# Patient Record
Sex: Male | Born: 1940 | Race: White | Hispanic: No | Marital: Married | Smoking: Never smoker
Health system: Southern US, Community
[De-identification: ages and names within clinical notes are randomized; demographics above are authoritative.]

## PROBLEM LIST (undated history)

## (undated) DIAGNOSIS — K7460 Unspecified cirrhosis of liver: Secondary | ICD-10-CM

## (undated) DIAGNOSIS — E785 Hyperlipidemia, unspecified: Secondary | ICD-10-CM

## (undated) DIAGNOSIS — Z8619 Personal history of other infectious and parasitic diseases: Secondary | ICD-10-CM

## (undated) HISTORY — DX: Hyperlipidemia, unspecified: E78.5

## (undated) HISTORY — DX: Unspecified cirrhosis of liver: K74.60

## (undated) HISTORY — DX: Personal history of other infectious and parasitic diseases: Z86.19

## (undated) HISTORY — PX: APPENDECTOMY: SHX54

---

## 2008-03-16 ENCOUNTER — Ambulatory Visit: Payer: Self-pay | Admitting: Family Medicine

## 2008-03-16 DIAGNOSIS — IMO0002 Reserved for concepts with insufficient information to code with codable children: Secondary | ICD-10-CM | POA: Insufficient documentation

## 2008-03-16 DIAGNOSIS — R030 Elevated blood-pressure reading, without diagnosis of hypertension: Secondary | ICD-10-CM | POA: Insufficient documentation

## 2008-03-16 DIAGNOSIS — E785 Hyperlipidemia, unspecified: Secondary | ICD-10-CM

## 2008-03-16 DIAGNOSIS — S93409A Sprain of unspecified ligament of unspecified ankle, initial encounter: Secondary | ICD-10-CM | POA: Insufficient documentation

## 2008-03-29 ENCOUNTER — Ambulatory Visit: Payer: Self-pay | Admitting: Family Medicine

## 2008-03-29 LAB — CONVERTED CEMR LAB
Nitrite: NEGATIVE
Specific Gravity, Urine: 1.015
WBC Urine, dipstick: NEGATIVE

## 2008-04-05 ENCOUNTER — Ambulatory Visit: Payer: Self-pay | Admitting: Family Medicine

## 2008-04-05 DIAGNOSIS — H9190 Unspecified hearing loss, unspecified ear: Secondary | ICD-10-CM | POA: Insufficient documentation

## 2008-04-05 DIAGNOSIS — R7309 Other abnormal glucose: Secondary | ICD-10-CM | POA: Insufficient documentation

## 2008-04-05 DIAGNOSIS — R0602 Shortness of breath: Secondary | ICD-10-CM | POA: Insufficient documentation

## 2008-04-05 LAB — CONVERTED CEMR LAB
ALT: 30 units/L (ref 0–53)
Albumin: 3.7 g/dL (ref 3.5–5.2)
Alkaline Phosphatase: 86 units/L (ref 39–117)
BUN: 13 mg/dL (ref 6–23)
CO2: 29 meq/L (ref 19–32)
Eosinophils Relative: 3.4 % (ref 0.0–5.0)
GFR calc Af Amer: 66 mL/min
Glucose, Bld: 144 mg/dL — ABNORMAL HIGH (ref 70–99)
HCT: 39.2 % (ref 39.0–52.0)
Hemoglobin: 13.9 g/dL (ref 13.0–17.0)
Lymphocytes Relative: 40.4 % (ref 12.0–46.0)
Monocytes Absolute: 0.4 10*3/uL (ref 0.1–1.0)
Monocytes Relative: 9.1 % (ref 3.0–12.0)
Neutro Abs: 2.2 10*3/uL (ref 1.4–7.7)
Platelets: 174 10*3/uL (ref 150–400)
Potassium: 3.4 meq/L — ABNORMAL LOW (ref 3.5–5.1)
Total CHOL/HDL Ratio: 5.3
Total Protein: 6.9 g/dL (ref 6.0–8.3)
WBC: 4.7 10*3/uL (ref 4.5–10.5)

## 2008-04-08 ENCOUNTER — Encounter: Payer: Self-pay | Admitting: Family Medicine

## 2008-04-08 LAB — CONVERTED CEMR LAB: Hgb A1c MFr Bld: 6.7 % — ABNORMAL HIGH (ref 4.6–6.0)

## 2008-04-26 ENCOUNTER — Ambulatory Visit: Payer: Self-pay | Admitting: Family Medicine

## 2008-04-26 LAB — CONVERTED CEMR LAB: OCCULT 3: NEGATIVE

## 2008-04-28 ENCOUNTER — Encounter: Payer: Self-pay | Admitting: Family Medicine

## 2009-03-13 ENCOUNTER — Telehealth: Payer: Self-pay | Admitting: Family Medicine

## 2009-04-03 ENCOUNTER — Ambulatory Visit: Payer: Self-pay | Admitting: Family Medicine

## 2009-04-03 DIAGNOSIS — M129 Arthropathy, unspecified: Secondary | ICD-10-CM

## 2009-04-03 DIAGNOSIS — K589 Irritable bowel syndrome without diarrhea: Secondary | ICD-10-CM

## 2009-04-18 ENCOUNTER — Telehealth: Payer: Self-pay | Admitting: Family Medicine

## 2009-09-25 ENCOUNTER — Telehealth: Payer: Self-pay | Admitting: Family Medicine

## 2009-09-27 ENCOUNTER — Encounter: Payer: Self-pay | Admitting: Family Medicine

## 2010-09-03 ENCOUNTER — Encounter: Payer: Self-pay | Admitting: Orthopedic Surgery

## 2010-09-11 NOTE — Letter (Signed)
Summary: Generic Letter  [place] at [facility]  [address] [place], [place] [address]   Phone: [phone]  Fax: [phone]    09/27/2009  Trial Court Administrator Attention: Lonn Georgia Request [address] [address]   Re:  Bennie Hind   [address]   [place]  [address]   Juror # [phone]   panel # [phone]  To Whom It May Concern:  Mr. Hardenbrook is a patient under my care and has a physical disability (deafness) in which would prevent him from serving.  Due to this permanent disability, I would like to request that you excuse him permantely.   Thank you for your consideration in this matter.        Sincerely,      Verdie Drown, M.D., FAAFP                                          Sincerely,   Madison Hickman, RN

## 2010-09-11 NOTE — Progress Notes (Signed)
Summary: Pt needing a letter for [employer] Note Call from Patient Call back at [facility] Phone [phone]   Caller: Patient Summary of Call: Pt called using [employer]. Pt is needing a note from Dr. Scotty Court to be excused from [place] duty because pt is deaf. Please let pt know when letter is ready for pick up or you can mail it to patient.  Initial call taken by: Lucy Antigua,  September 25, 2009 10:10 AM  Follow-up for Phone Call         i need the letter from the court adminstrator  Follow-up by: Pura Spice, RN,  September 26, 2009 8:35 AM  Additional Follow-up for Phone Call Additional follow up Details #1::        What letter?  We usually just sent a note stating pt is not able to attend jury duty with her medical problems???  Not sure what you need, or where to get it? Additional Follow-up by: Lynann Beaver CMA,  September 26, 2009 8:48 AM    Additional Follow-up for Phone Call Additional follow up Details #2::    they send pt letter stating court number  along wth juror number and panel number  Follow-up by: Pura Spice, RN,  September 26, 2009 9:28 AM  Additional Follow-up for Phone Call Additional follow up Details #3:: Details for Additional Follow-up Action Taken: Left message for pt to bring court papers Additional Follow-up by: Lynann Beaver CMA,  September 26, 2009 9:40 AM

## 2011-07-22 ENCOUNTER — Telehealth: Payer: Self-pay | Admitting: Family Medicine

## 2011-07-22 NOTE — Telephone Encounter (Signed)
Ok but myschedule is backed up and not until  After the new year.

## 2011-07-22 NOTE — Telephone Encounter (Signed)
Is a [facility] pt and is requesting to be switched to you his daughter Ronald Jensen was also a stafford pt and has converted over to you. Pt would love to be seen by you because [name] is handicapped and having the same pcp would make things easier on family. Pt is aware that we are no longer excepting any more stafford pts that we are at the max but requested to ask you. Please advise

## 2011-08-27 ENCOUNTER — Encounter: Payer: Self-pay | Admitting: Internal Medicine

## 2011-09-06 ENCOUNTER — Ambulatory Visit: Payer: Self-pay | Admitting: Internal Medicine

## 2011-10-08 ENCOUNTER — Ambulatory Visit: Payer: Self-pay | Admitting: Internal Medicine

## 2011-10-08 DIAGNOSIS — Z0289 Encounter for other administrative examinations: Secondary | ICD-10-CM

## 2011-11-01 ENCOUNTER — Ambulatory Visit (INDEPENDENT_AMBULATORY_CARE_PROVIDER_SITE_OTHER): Payer: Medicare Other | Admitting: Internal Medicine

## 2011-11-01 ENCOUNTER — Encounter: Payer: Self-pay | Admitting: Internal Medicine

## 2011-11-01 VITALS — BP 140/84 | HR 74 | Temp 98.3°F | Wt 229.0 lb

## 2011-11-01 DIAGNOSIS — K8020 Calculus of gallbladder without cholecystitis without obstruction: Secondary | ICD-10-CM | POA: Insufficient documentation

## 2011-11-01 DIAGNOSIS — K766 Portal hypertension: Secondary | ICD-10-CM

## 2011-11-01 DIAGNOSIS — H9190 Unspecified hearing loss, unspecified ear: Secondary | ICD-10-CM

## 2011-11-01 DIAGNOSIS — Z972 Presence of dental prosthetic device (complete) (partial): Secondary | ICD-10-CM | POA: Insufficient documentation

## 2011-11-01 DIAGNOSIS — Z98811 Dental restoration status: Secondary | ICD-10-CM

## 2011-11-01 DIAGNOSIS — I708 Atherosclerosis of other arteries: Secondary | ICD-10-CM

## 2011-11-01 DIAGNOSIS — R188 Other ascites: Secondary | ICD-10-CM | POA: Insufficient documentation

## 2011-11-01 DIAGNOSIS — E8809 Other disorders of plasma-protein metabolism, not elsewhere classified: Secondary | ICD-10-CM

## 2011-11-01 DIAGNOSIS — K7460 Unspecified cirrhosis of liver: Secondary | ICD-10-CM | POA: Insufficient documentation

## 2011-11-01 LAB — BASIC METABOLIC PANEL
BUN: 10 mg/dL (ref 6–23)
CO2: 29 mEq/L (ref 19–32)
Chloride: 103 mEq/L (ref 96–112)
Glucose, Bld: 134 mg/dL — ABNORMAL HIGH (ref 70–99)
Potassium: 4.4 mEq/L (ref 3.5–5.3)

## 2011-11-01 LAB — AMMONIA: Ammonia: 67 umol/L — ABNORMAL HIGH (ref 16–53)

## 2011-11-01 NOTE — Progress Notes (Signed)
Subjective:    Patient ID: Ronald Jensen, male    DOB: Aug 04, 1941, 71 y.o.   MRN: [id number]  HPI Patient comes in as new patient visit to this provider  . Previous care was Dr Scotty Court . He has deafness and has daughter here for signing. Patient seen last time by Dr. Scotty Court about 3 years ago.  His last checkup was 4 years ago.  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     Except for some mild arthritis he really has not had any chronic disease until he presented March 12 to 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  At that time he had evaluation which showed cirrhosis felt most likely to [facility] with an autoimmune workup pending. His viral serologies were negative. He was also shown to have a pancreatic irregularity on CT and an MRI was done which showed pancreatic atrophy of the tail findings could be consistent with focal chronic pancreatitis or stricture but in a cold pancreatic ductal adenocarcinoma could not be completely excluded and GI consultation was recommended for a possible EUS versus short-term and routine in 3 months that showed no significant abnormality. He was also noted to have cholelithiasis in his gallbladder without wall thickening. Scans also showed atherosclerosis in his aorta without aneurysm and some coronary artery calcifications.  Echo cardiogram  Neg. Nl lv function  Valves. A small 5mm nodule left breast . He was begun on 2 diuretics ;spironolactone and Lasix and since that time he has lost at least 11 pounds. He denies any shortness of breath; his swelling in his abdomen is down but still significant as well as his feet. He denies any fever cough bleeding.  No falling. No change in his vision.  He has had some faint jaundice.  In hospital he had a mildly elevated ammonia at 54 and  was started on lactulose. He denies any shaking and no asterisks  Or sedation.    Patient and daughter are here to go over the diagnosis and explained some of the study results. He has an appointment on April 10 with the GI specialist Dr. Chestine Spore in [place] [facility] appetite is okay no fever bleeding coughing syncope  Rest as per hpi  Past history family history social history directly entered in the electronic medical record. By provider      Objective:   Physical Exam WDWN in nad  Deaf and daughter   Signing HEENT [place] ear nl perrla eoms nl OP clear midline tongue .Nares patent. Neck: Supple without adenopathy or masses or bruits Chest:  Clear to A&P without wheezes rales or rhonchi CV:  S1-S2 no gallops or murmurs peripheral perfusion is normal Breast: normal by inspection . No dimpling, discharge, masses, tenderness or discharge .left is lumpy but no discrete mass Abd: Large but not tense there is ascites nontender there is some. Umbilical induration? possible varices.  No masses are felt otherwise. Non tender Extremities shows 2-3+ edema no lesions noted NEURO: alert responsive bu sign   eoms nl  No focal muscle weakness or atrophy. Gait WNL.  Grossly non focal. No tremor or abnormal movement. No asterixis  Skin: normal capillary refill ,turgor ,  No acute rashes ,petechiae or bruising ? No jaundice.  Laboratory studies from a week or so ago  showed a sodium of 134 creatinine of 0.86 albumin 2.4 LFTs normal otherwise   H&H 11.4 over 33.9 hepatitis screen negative  plts 83       Assessment & Plan   Newly diagnosed hepatic cirrhosis with portal hypertension. Undetermined etiology suspected Elita Boone. Autoimmune evaluation pending. Ascites and anasarca related improved on diuretic.  Asymptomatic vascular disease atherosclerosis in imaging studies including coronary arteries left main and LAD  Abnormality on pancreatic MRI mild ductal dilatation needs  followup  Gallstones Breast area prob insignificant .   Pneumovax given today .  Discussion with patient and daughter today about the current diagnosis and condition as best possible from the records available today. This is the first time I have met this patient and her primary care office.  We'll plan laboratory studies today to include BMP ammonia and a pro time will send these results to a specialist ; then plan followup.  Total visit > 50% spent counseling and coordinating care  Reviewed data

## 2011-11-01 NOTE — Patient Instructions (Addendum)
Will notify you  of labs when available. And  Send to your specialist .  Make sure  Liver specialist  Gets [place] a copy of their evaluation.  Advise pneumovax   Ask your specialist about this.   Cirrhosis Cirrhosis is a condition of scarring of the liver which is caused when the liver has tried repairing itself following damage. This damage may come from a previous infection such as one of the forms of hepatitis (usually hepatitis C), or the damage may come from being injured by toxins. The main toxin that causes this damage is alcohol. The scarring of the liver from use of alcohol is irreversible. That means the liver cannot return to normal even though alcohol is not used any more. The main danger of hepatitis C infection is that it may cause long-lasting (chronic) liver disease, and this also may lead to cirrhosis. This complication is progressive and irreversible. CAUSES  Prior to available blood tests, hepatitis C could be contracted by blood transfusions. Since testing of blood has improved, this is now unlikely. This infection can also be contracted through intravenous drug use and the sharing of needles. It can also be contracted through sexual relationships. The injury caused by alcohol comes from too much use. It is not a few drinks that poison the liver, but years of misuse. Usually there will be some signs and symptoms early with scarring of the liver that suggest the development of better habits. Alcohol should never be used while using acetaminophen. A small dose of both taken together may cause irreversible damage to the liver. HOME CARE INSTRUCTIONS  There is no specific treatment for cirrhosis. However, there are things you can do to avoid making the condition worse.  Rest as needed.   Eat a well-balanced diet. Your caregiver can help you with suggestions.   Vitamin supplements including vitamins A, K, D, and thiamine can help.   A low-salt diet, water restriction, or diuretic  medicine may be needed to reduce fluid retention.   Avoid alcohol. This can be extremely toxic if combined with acetaminophen.   Avoid drugs which are toxic to the liver. Some of these include isoniazid, methyldopa, acetaminophen, anabolic steroids (muscle-building drugs), erythromycin, and oral contraceptives (birth control pills). Check with your caregiver to make sure medicines you are presently taking will not be harmful.   Periodic blood tests may be required. Follow your caregiver's advice regarding the timing of these.   Milk thistle is an herbal remedy which does protect the liver against toxins. However, it will not help once the liver has been scarred.  SEEK MEDICAL CARE IF:  You have increasing fatigue or weakness.   You develop swelling of the hands, feet, legs, or face.   You vomit bright red blood, or a coffee ground appearing material.   You have blood in your stools, or the stools turn black and tarry.   You have a fever.   You develop loss of appetite, or have nausea and vomiting.   You develop jaundice.   You develop easy bruising or bleeding.   You have worsening of any of the problems you are concerned about.  Document Released: 07/29/2005 Document Revised: 07/18/2011 Document Reviewed: 03/16/2008 [facility] Patient Information 2012 [place], [place].

## 2011-11-04 ENCOUNTER — Telehealth: Payer: Self-pay

## 2011-11-04 NOTE — Telephone Encounter (Signed)
[facility] Triage Call Report Triage Record Num: [id number] Operator: Caswell Corwin Patient Name: Ronald Jensen Call Date & Time: 11/01/2011 7:41:24PM Patient Phone: [phone] PCP: Patient Gender: Male PCP Fax : Patient DOB: Jun 25, 1941 Practice Name: [facility] Reason for Call: OFFICE nOTE! Caller: Delaney Meigs from [employer] . ; PCP: Madelin Headings.; CB#: [phone]; Call regarding a ammonia ordered on [name], [name] by Madelin Headings.. Results are hi at 67.0. Blood not hemolyzed. No previous. SHE WILL FAX TO [facility]. OFFICE NOTE! TY! Protocol(s) Used: Office Note Recommended Outcome per Protocol: Information Noted and Sent to Office Reason for Outcome: Caller information to office Care Advice: ~ 11/01/2011 7:46:18PM Page 1 of 1 [id number]

## 2011-11-06 ENCOUNTER — Encounter: Payer: Self-pay | Admitting: *Deleted

## 2011-11-06 NOTE — Progress Notes (Signed)
Quick Note:    Letter sent to pt.  ______

## 2011-11-27 ENCOUNTER — Telehealth: Payer: Self-pay | Admitting: Internal Medicine

## 2011-11-27 NOTE — Telephone Encounter (Signed)
Pt's daughter is aware.  FMLA paperwork is complete and has been faxed.  A copy is ready for pt to pick up.

## 2011-11-27 NOTE — Telephone Encounter (Signed)
Tell Okey Regal I filled out the fmla forms . dont have  specific dates of his appts at [facility]. Unsure if needed for the form. Please make sure I get copies of  Evaluations .done there.   At some point I need  Mr Rothlisberger to come in for another appts  After evaluation at [facility] is complete.  Thanks

## 2011-12-11 ENCOUNTER — Other Ambulatory Visit: Payer: Self-pay | Admitting: Internal Medicine

## 2011-12-11 ENCOUNTER — Telehealth: Payer: Self-pay

## 2011-12-11 NOTE — Telephone Encounter (Signed)
Per pt's daughter the FMLA paperwork for father requires more information.  Per pt's daughter more information on 5, 6 and 7 and the paperwork is due back on 12/12/11.

## 2011-12-12 ENCOUNTER — Telehealth: Payer: Self-pay | Admitting: *Deleted

## 2011-12-12 NOTE — Telephone Encounter (Signed)
Okey Regal (daughter) is calling to see if we received a refill request (fax) for a laxative for her Dad.  Please call her if there is a problem with this as he really needs it.

## 2011-12-12 NOTE — Telephone Encounter (Signed)
Spoke with pt's daughter and she is aware that fmla papers have been updated and re-faxed.

## 2011-12-12 NOTE — Telephone Encounter (Signed)
Spoke with pt's daughter and she states that pt was given generlac 10 mg when he was seen at [facility] ER back in March 2013.  Per pt's daughter the medication was sent to Dr. Marzetta Merino and returned back to the pharmacy stating " needs medication management by PCP".  Pt's daughter states that pt will not see a liver specialist until June 2013 but pt has had a series of endoscopies done at [facility] per Dr. Fabian Sharp to fill medication.  Rx sent to pharmacy.

## 2011-12-31 ENCOUNTER — Other Ambulatory Visit: Payer: Self-pay

## 2011-12-31 MED ORDER — SPIRONOLACTONE 50 MG PO TABS: 50.0000 mg | ORAL_TABLET | Freq: Two times a day (BID) | ORAL | Status: DC

## 2011-12-31 NOTE — Telephone Encounter (Signed)
Rx sent to pharmacy   

## 2012-08-18 ENCOUNTER — Ambulatory Visit (INDEPENDENT_AMBULATORY_CARE_PROVIDER_SITE_OTHER): Payer: Medicare Other | Admitting: Internal Medicine

## 2012-08-18 ENCOUNTER — Encounter: Payer: Self-pay | Admitting: Internal Medicine

## 2012-08-18 VITALS — BP 160/70 | HR 103 | Temp 98.3°F | Ht 72.0 in | Wt 226.0 lb

## 2012-08-18 DIAGNOSIS — Z0000 Encounter for general adult medical examination without abnormal findings: Secondary | ICD-10-CM

## 2012-08-18 DIAGNOSIS — H9190 Unspecified hearing loss, unspecified ear: Secondary | ICD-10-CM

## 2012-08-18 DIAGNOSIS — Z23 Encounter for immunization: Secondary | ICD-10-CM

## 2012-08-18 DIAGNOSIS — R7309 Other abnormal glucose: Secondary | ICD-10-CM

## 2012-08-18 DIAGNOSIS — E785 Hyperlipidemia, unspecified: Secondary | ICD-10-CM

## 2012-08-18 DIAGNOSIS — R030 Elevated blood-pressure reading, without diagnosis of hypertension: Secondary | ICD-10-CM

## 2012-08-18 DIAGNOSIS — K7460 Unspecified cirrhosis of liver: Secondary | ICD-10-CM

## 2012-08-18 LAB — CBC WITH DIFFERENTIAL/PLATELET
Basophils Relative: 0.4 % (ref 0.0–3.0)
Eosinophils Absolute: 0.4 10*3/uL (ref 0.0–0.7)
Lymphs Abs: 1.2 10*3/uL (ref 0.7–4.0)
MCHC: 34.5 g/dL (ref 30.0–36.0)
MCV: 96.4 fl (ref 78.0–100.0)
Monocytes Absolute: 0.6 10*3/uL (ref 0.1–1.0)
Neutrophils Relative %: 59.9 % (ref 43.0–77.0)
Platelets: 79 10*3/uL — ABNORMAL LOW (ref 150.0–400.0)

## 2012-08-18 LAB — LIPID PANEL
Cholesterol: 178 mg/dL (ref 0–200)
HDL: 54.6 mg/dL (ref >39.00)
Total CHOL/HDL Ratio: 3
Triglycerides: 108 mg/dL (ref 0.0–149.0)

## 2012-08-18 LAB — BASIC METABOLIC PANEL
BUN: 11 mg/dL (ref 6–23)
CO2: 25 mEq/L (ref 19–32)
Chloride: 105 mEq/L (ref 96–112)
Creatinine, Ser: 1 mg/dL (ref 0.4–1.5)

## 2012-08-18 LAB — HEPATIC FUNCTION PANEL
Bilirubin, Direct: 0.5 mg/dL — ABNORMAL HIGH (ref 0.0–0.3)
Total Bilirubin: 2.1 mg/dL — ABNORMAL HIGH (ref 0.3–1.2)
Total Protein: 6.8 g/dL (ref 6.0–8.3)

## 2012-08-18 LAB — HEMOGLOBIN A1C: Hgb A1c MFr Bld: 7.4 % — ABNORMAL HIGH (ref 4.6–6.5)

## 2012-08-18 NOTE — Progress Notes (Signed)
Chief Complaint  Patient presents with  . Annual Exam    HPI: Patient comes in today for Preventive Health Care visit  Last visit was almost a year ago .   At that time his first visit he was diagnosed with cirrhotic liver ascites gallstone portal hypertension and was evaluated at [facility] by specialist. Since that time he has done well but I haven't received any records from them about how he is doing he states that he has a good appetite no chest pain shortness of breath vomiting swelling in his legs he continues on Lasix.  No falling no unusual bleeding. Daughter states that he had an endoscopy and a pancreatic evaluation and perhaps other procedures.     He currently lives alone widowed doing well no falling. Daughter is here for signing for interpretation.     Hearing: deaf since age 60   Vision:  No limitations at present . Last eye check UTD  Safety:  Has smoke detector and wears seat belts.  No firearms. No excess sun exposure. Sees dentist regularly.  Falls: NONE  Advance directive :  Reviewed .  Memory: Felt to be good  , no concern from her or her family.  Depression: No anhedonia unusual crying or depressive symptoms  Nutrition: Eats well balanced diet; adequate calcium and vitamin D. No swallowing chewing problems.  Injury: no major injuries in the last six months.  Other healthcare providers:  Reviewed today .  Social:  Lives alone. No pets.   Preventive parameters: up-to-date  Reviewed has had? Hep vaccine  hasnt done flu vaccine in past   ADLS:   There are no problems or need for assistance  driving, feeding, obtaining food, dressing, toileting and bathing, managing money using phone. is independent.  EXERCISE/ HABITS  Per week   No tobacco    etoh    ROS:  GEN/ HEENT: No fever, significant weight changes sweats headaches vision changes, CV/ PULM; No chest pain shortness of breath cough, syncope,edema  change in exercise tolerance. GI /GU: No  adominal pain, vomiting, change in bowel habits. No blood in the stool. No significant GU symptoms. SKIN/HEME: ,no acute skin rashes suspicious lesions or bleeding. No lymphadenopathy, nodules, masses.  NEURO/ PSYCH:  No neurologic signs such as weakness numbness. No depression anxiety. IMM/ Allergy: No unusual infections.  Allergy .   REST of 12 system review negative except as per HPI   Past Medical History  Diagnosis Date  . Migraine   . Hyperlipidemia   . History of chickenpox     Family History  Problem Relation Age of Onset  . Hearing loss      2 brothers   . Stroke      3 brothers ages 58 and another in his 4s.  . Alcohol abuse Father     Died age 30 from complications of respiratory exposure.  . Diabetes Mother   . Diabetes Brother   . Osteoarthritis Mother     History   Social History  . Marital Status: Married    Spouse Name: N/A    Number of Children: N/A  . Years of Education: N/A   Social History Main Topics  . Smoking status: Never Smoker   . Smokeless tobacco: Never Used  . Alcohol Use: Yes     Comment: rarely   . Drug Use: None  . Sexually Active: None   Other Topics Concern  . None   Social History Narrative   Widowed  2 yearBecame deaf at age 4Retired from the [employer] and records ad designerNeg tad remote tobacco  Age 89- 73 HH o f 3  No pets  Daughter with downs died last year 01/17/2012 6 hours sleep Neg ets fa  etoh  Upper dentures     Outpatient Encounter Prescriptions as of 08/18/2012  Medication Sig Dispense Refill  . furosemide (LASIX) 40 MG tablet Take 40 mg by mouth daily.       Marland Kitchen GENERLAC 10 GM/15ML SOLN TAKE 30 ML BY MOUTH EVERY DAY AS DIRECTED . TAKE MORE OR LESS TO ACHIEVE 2 TO 3 LOOSE STOOLS PER DAY  900 mL  0  . hyoscyamine (LEVBID) 0.375 MG 12 hr tablet Take 0.375 mg by mouth every 12 (twelve) hours as needed. Before evening meal      . nadolol (CORGARD) 20 MG tablet Take 20 mg by mouth daily.       Marland Kitchen spironolactone (ALDACTONE) 50 MG  tablet Take 1 tablet (50 mg total) by mouth 2 (two) times daily.  60 tablet  1    EXAM:  BP 160/70  Pulse 103  Temp 98.3 F (36.8 C) (Oral)  Ht 6' (1.829 m)  Wt 226 lb (102.513 kg)  BMI 30.65 kg/m2  Body mass index is 30.65 kg/(m^2).  Physical Exam: Vital signs reviewed WUJ:WJXB is a well-developed well-nourished alert cooperative   male who appears her stated age in no acute distress.  HEENT: normocephalic atraumatic , Eyes: PERRL EOM's full, conjunctiva clear, Nares: paten,t no deformity discharge or tenderness., Ears: no deformity EAC's clear TMs with normal landmarks. Mouth: clear OP, no lesions, edema.  Moist mucous membranes. Dentition  Dentures  NECK: supple without masses, thyromegaly or bruits. CHEST/PULM:  Clear to auscultation and percussion breath sounds equal no wheeze , rales or rhonchi. No chest wall deformities or tenderness. CV: PMI is nondisplaced, S1 S2 no gallops, murmurs, rubs. Peripheral pulses are full without delay.No JVD .  ABDOMEN: Bowel sounds normal nontender  No guard or rebound, no hepato splenomegal no CVA tenderness.  No hernia.  Soft ? Fluid not tense and no varices noted  Extremtities:  No clubbing cyanosis  slight edema, no acute joint swelling or redness no focal atrophy NEURO:  Oriented x3, cranial nerves 3-12 appear to be intact save the hearing , no obvious focal weakness,gait within normal limits no abnormal reflexes or asymmetrical no asterixis  SKIN: No acute rashes normal turgor, color, no bruising or petechiae. PSYCH: Oriented, good eye contact, no obvious depression anxiety, cognition and judgment appear normal. LN: no cervical axillary inguinal adenopathy Rectal prostate 1 + no nodule   Lab Results  Component Value Date   WBC 4.7 03/29/2008   HGB 13.9 03/29/2008   HCT 39.2 03/29/2008   PLT 174 03/29/2008   GLUCOSE 134* 11/01/2011   CHOL 167 03/29/2008   TRIG 138 03/29/2008   HDL 31.7* 03/29/2008   LDLCALC 108* 03/29/2008   ALT 30 03/29/2008    AST 21 03/29/2008   NA 136 11/01/2011   K 4.4 11/01/2011   CL 103 11/01/2011   CREATININE 1.00 11/01/2011   BUN 10 11/01/2011   CO2 29 11/01/2011   TSH 4.15 03/29/2008   PSA 0.62 03/29/2008   INR 1.27 11/01/2011   HGBA1C 6.7* 04/05/2008   EKG NSR  ASSESSMENT AND PLAN:  Discussed the following assessment and plan:  1. Visit for preventive health examination  Basic metabolic panel, CBC with Differential, Hepatic function panel, Lipid panel, TSH, Hemoglobin  A1c, EKG 12-Lead   disc get flu vaccine today   2. HYPERLIPIDEMIA  Basic metabolic panel, CBC with Differential, Hepatic function panel, Lipid panel, TSH, Hemoglobin A1c  3. DEAFNESS, CONGENITAL  Basic metabolic panel, CBC with Differential, Hepatic function panel, Lipid panel, TSH, Hemoglobin A1c  4. Cirrhosis of liver  Basic metabolic panel, CBC with Differential, Hepatic function panel, Lipid panel, TSH, Hemoglobin A1c   get records seems to be doing well   5. ELEVATED BLOOD PRESSURE  Basic metabolic panel, CBC with Differential, Hepatic function panel, Lipid panel, TSH, Hemoglobin A1c, EKG 12-Lead   may need meds disc this and rov in 1 month   6. Need for prophylactic vaccination and inoculation against influenza    7. HYPERGLYCEMIA     Because we haven't seen him in a while and he is in  specialty care : other things we can address up follow up. Cardiovascular factors to address are elevated blood pressure noted atherosclerosis  sclerosis when his CT scan was done last year.  Currently no sx and nl ekg.  Patient Care Team: Madelin Headings, MD as PCP - General (Internal Medicine) Patient Instructions  Your blood pressure is elevated today  Get a monitor and check readings . We need to add medication if the readings are consistently elevated. Check about daily and record readings  ROV in 1 month with machine and readings .  May need to start medication. Contact us if readings consistently 160 and above  Before then .  Flu vaccine today    Will notify you  of labs when available. Can sigh up for My chart to view results.  Get  [place] report  Or copy of visits to [place] .  How to Take Your Blood Pressure  These instructions are only for electronic home blood pressure machines. You will need:   An automatic or semi-automatic blood pressure machine.  Fresh batteries for the blood pressure machine. HOW DO I USE THESE TOOLS TO CHECK MY BLOOD PRESSURE?   There are 2 numbers that make up your blood pressure. For example: 120/80.  The first number (120 in our example) is called the "systolic pressure." It is a measure of the pressure in your blood vessels when your heart is pumping blood.  The second number (80 in our example) is called the "diastolic pressure." It is a measure of the pressure in your blood vessels when your heart is resting between beats.  Before you buy a home blood pressure machine, check the size of your arm so you can buy the right size cuff. Here is how to check the size of your arm:  Use a tape measure that shows both inches and centimeters.  Wrap the tape measure around the middle upper part of your arm. You may need someone to help you measure right.  Write down your arm measurement in both inches and centimeters.  To measure your blood pressure right, it is important to have the right size cuff.  If your arm is up to 13 inches (37 to 34 centimeters), get an adult cuff size.  If your arm is 13 to 17 inches (35 to 44 centimeters), get a large adult cuff size.  If your arm is 17 to 20 inches (45 to 52 centimeters), get an adult thigh cuff.  Try to rest or relax for at least 30 minutes before you check your blood pressure.  Do not smoke.  Do not have any drinks with caffeine, such as:  Pop.  Coffee.  Tea.  Check your blood pressure in a quiet room.  Sit down and stretch out your arm on a table. Keep your arm at about the level of your heart. Let your arm relax. GETTING BLOOD PRESSURE  READINGS  Make sure you remove any tight-fighting clothing from your arm. Wrap the cuff around your upper arm. Wrap it just above the bend, and above where you felt the pulse. You should be able to slip a finger between the cuff and your arm. If you cannot slip a finger in the cuff, it is too tight and should be removed and rewrapped.  Some units requires you to manually pump up the arm cuff.  Automatic units inflate the cuff when you press a button.  Cuff deflation is automatic in both models.  After the cuff is inflated, the unit measures your blood pressure and pulse. The readings are displayed on a monitor. Hold still and breathe normally while the cuff is inflated.  Getting a reading takes less than a minute.  Some models store readings in a memory. Some provide a printout of readings.  Get readings at different times of the day. You should wait at least 5 minutes between readings. Take readings with you to your next doctor's visit. Document Released: 07/11/2008 Document Revised: 10/21/2011 Document Reviewed: 07/11/2008 [facility] Patient Information 2013 [place], [place].   Preventive Care for Adults, Male A healthy lifestyle and preventive care can promote health and wellness. Preventive health guidelines for men include the following key practices:  A routine yearly physical is a good way to check with your caregiver about your health and preventative screening. It is a chance to share any concerns and updates on your health, and to receive a thorough exam.  Visit your dentist for a routine exam and preventative care every 6 months. Brush your teeth twice a day and floss once a day. Good oral hygiene prevents tooth decay and gum disease.  The frequency of eye exams is based on your age, health, family medical history, use of contact lenses, and other factors. Follow your caregiver's recommendations for frequency of eye exams.  Eat a healthy diet. Foods like vegetables, fruits,  whole grains, low-fat dairy products, and lean protein foods contain the nutrients you need without too many calories. Decrease your intake of foods high in solid fats, added sugars, and salt. Eat the right amount of calories for you.Get information about a proper diet from your caregiver, if necessary.  Regular physical exercise is one of the most important things you can do for your health. Most adults should get at least 150 minutes of moderate-intensity exercise (any activity that increases your heart rate and causes you to sweat) each week. In addition, most adults need muscle-strengthening exercises on 2 or more days a week.  Maintain a healthy weight. The body mass index (BMI) is a screening tool to identify possible weight problems. It provides an estimate of body fat based on height and weight. Your caregiver can help determine your BMI, and can help you achieve or maintain a healthy weight.For adults 20 years and older:  A BMI below 18.5 is considered underweight.  A BMI of 18.5 to 24.9 is normal.  A BMI of 25 to 29.9 is considered overweight.  A BMI of 30 and above is considered obese.  Maintain normal blood lipids and cholesterol levels by exercising and minimizing your intake of saturated fat. Eat a balanced diet with plenty of fruit and vegetables. Blood  tests for lipids and cholesterol should begin at age 77 and be repeated every 5 years. If your lipid or cholesterol levels are high, you are over 50, or you are a high risk for heart disease, you may need your cholesterol levels checked more frequently.Ongoing high lipid and cholesterol levels should be treated with medicines if diet and exercise are not effective.  If you smoke, find out from your caregiver how to quit. If you do not use tobacco, do not start.  If you choose to drink alcohol, do not exceed 2 drinks per day. One drink is considered to be 12 ounces (355 mL) of beer, 5 ounces (148 mL) of wine, or 1.5 ounces (44 mL)  of liquor.  Avoid use of street drugs. Do not share needles with anyone. Ask for help if you need support or instructions about stopping the use of drugs.  High blood pressure causes heart disease and increases the risk of stroke. Your blood pressure should be checked at least every 1 to 2 years. Ongoing high blood pressure should be treated with medicines, if weight loss and exercise are not effective.  If you are 20 to 70 years old, ask your caregiver if you should take aspirin to prevent heart disease.  Diabetes screening involves taking a blood sample to check your fasting blood sugar level. This should be done once every 3 years, after age 79, if you are within normal weight and without risk factors for diabetes. Testing should be considered at a younger age or be carried out more frequently if you are overweight and have at least 1 risk factor for diabetes.  Colorectal cancer can be detected and often prevented. Most routine colorectal cancer screening begins at the age of 45 and continues through age 77. However, your caregiver may recommend screening at an earlier age if you have risk factors for colon cancer. On a yearly basis, your caregiver may provide home test kits to check for hidden blood in the stool. Use of a small camera at the end of a tube, to directly examine the colon (sigmoidoscopy or colonoscopy), can detect the earliest forms of colorectal cancer. Talk to your caregiver about this at age 57, when routine screening begins. Direct examination of the colon should be repeated every 5 to 10 years through age 1, unless early forms of pre-cancerous polyps or small growths are found.  Hepatitis C blood testing is recommended for all people born from [birth year] through 1965 and any individual with known risks for hepatitis C.  Practice safe sex. Use condoms and avoid high-risk sexual practices to reduce the spread of sexually transmitted infections (STIs). STIs include gonorrhea, chlamydia,  syphilis, trichomonas, herpes, HPV, and human immunodeficiency virus (HIV). Herpes, HIV, and HPV are viral illnesses that have no cure. They can result in disability, cancer, and death.  A one-time screening for abdominal aortic aneurysm (AAA) and surgical repair of large AAAs by sound wave imaging (ultrasonography) is recommended for ages 1 to 38 years who are current or former smokers.  Healthy men should no longer receive prostate-specific antigen (PSA) blood tests as part of routine cancer screening. Consult with your caregiver about prostate cancer screening.  Testicular cancer screening is not recommended for adult males who have no symptoms. Screening includes self-exam, caregiver exam, and other screening tests. Consult with your caregiver about any symptoms you have or any concerns you have about testicular cancer.  Use sunscreen with skin protection factor (SPF) of 30 or more. Apply sunscreen  liberally and repeatedly throughout the day. You should seek shade when your shadow is shorter than you. Protect yourself by wearing long sleeves, pants, a wide-brimmed hat, and sunglasses year round, whenever you are outdoors.  Once a month, do a whole body skin exam, using a mirror to look at the skin on your back. Notify your caregiver of new moles, moles that have irregular borders, moles that are larger than a pencil eraser, or moles that have changed in shape or color.  Stay current with required immunizations.  Influenza. You need a dose every fall (or winter). The composition of the flu vaccine changes each year, so being vaccinated once is not enough.  Pneumococcal polysaccharide. You need 1 to 2 doses if you smoke cigarettes or if you have certain chronic medical conditions. You need 1 dose at age 54 (or older) if you have never been vaccinated.  Tetanus, diphtheria, pertussis (Tdap, Td). Get 1 dose of Tdap vaccine if you are younger than age 35 years, are over 41 and have contact with an  infant, are a Research scientist (physical sciences), or simply want to be protected from whooping cough. After that, you need a Td booster dose every 10 years. Consult your caregiver if you have not had at least 3 tetanus and diphtheria-containing shots sometime in your life or have a deep or dirty wound.  HPV. This vaccine is recommended for males 13 through 70 years of age. This vaccine may be given to men 22 through 70 years of age who have not completed the 3 dose series. It is recommended for men through age 45 who have sex with men or whose immune system is weakened because of HIV infection, other illness, or medications. The vaccine is given in 3 doses over 6 months.  Measles, mumps, rubella (MMR). You need at least 1 dose of MMR if you were born in 1957 or later. You may also need a 2nd dose.  Meningococcal. If you are age 51 to 78 years and a Orthoptist living in a residence hall, or have one of several medical conditions, you need to get vaccinated against meningococcal disease. You may also need additional booster doses.  Zoster (shingles). If you are age 31 years or older, you should get this vaccine.  Varicella (chickenpox). If you have never had chickenpox or you were vaccinated but received only 1 dose, talk to your caregiver to find out if you need this vaccine.  Hepatitis A. You need this vaccine if you have a specific risk factor for hepatitis A virus infection, or you simply wish to be protected from this disease. The vaccine is usually given as 2 doses, 6 to 18 months apart.  Hepatitis B. You need this vaccine if you have a specific risk factor for hepatitis B virus infection or you simply wish to be protected from this disease. The vaccine is given in 3 doses, usually over 6 months. Preventative Service / Frequency Ages 58 to 78  Blood pressure check.** / Every 1 to 2 years.  Lipid and cholesterol check.** / Every 5 years beginning at age 39.  Hepatitis C blood test.** / For  any individual with known risks for hepatitis C.  Skin self-exam. / Monthly.  Influenza immunization.** / Every year.  Pneumococcal polysaccharide immunization.** / 1 to 2 doses if you smoke cigarettes or if you have certain chronic medical conditions.  Tetanus, diphtheria, pertussis (Tdap,Td) immunization. / A one-time dose of Tdap vaccine. After that, you need a Td  booster dose every 10 years.  HPV immunization. / 3 doses over 6 months, if 26 and younger.  Measles, mumps, rubella (MMR) immunization. / You need at least 1 dose of MMR if you were born in 1957 or later. You may also need a 2nd dose.  Meningococcal immunization. / 1 dose if you are age 100 to 69 years and a Orthoptist living in a residence hall, or have one of several medical conditions, you need to get vaccinated against meningococcal disease. You may also need additional booster doses.  Varicella immunization.** / Consult your caregiver.  Hepatitis A immunization.** / Consult your caregiver. 2 doses, 6 to 18 months apart.  Hepatitis B immunization.** / Consult your caregiver. 3 doses usually over 6 months. Ages 28 to 27  Blood pressure check.** / Every 1 to 2 years.  Lipid and cholesterol check.** / Every 5 years beginning at age 73.  Fecal occult blood test (FOBT) of stool. / Every year beginning at age 9 and continuing until age 54. You may not have to do this test if you get colonoscopy every 10 years.  Flexible sigmoidoscopy** or colonoscopy.** / Every 5 years for a flexible sigmoidoscopy or every 10 years for a colonoscopy beginning at age 18 and continuing until age 36.  Hepatitis C blood test.** / For all people born from [birth year] through 1963 and any individual with known risks for hepatitis C.  Skin self-exam. / Monthly.  Influenza immunization.** / Every year.  Pneumococcal polysaccharide immunization.** / 1 to 2 doses if you smoke cigarettes or if you have certain chronic medical  conditions.  Tetanus, diphtheria, pertussis (Tdap/Td) immunization.** / A one-time dose of Tdap vaccine. After that, you need a Td booster dose every 10 years.  Measles, mumps, rubella (MMR) immunization. / You need at least 1 dose of MMR if you were born in 1957 or later. You may also need a 2nd dose.  Varicella immunization.**/ Consult your caregiver.  Meningococcal immunization.** / Consult your caregiver.  Hepatitis A immunization.** / Consult your caregiver. 2 doses, 6 to 18 months apart.  Hepatitis B immunization.** / Consult your caregiver. 3 doses, usually over 6 months. Ages 23 and over  Blood pressure check.** / Every 1 to 2 years.  Lipid and cholesterol check.**/ Every 5 years beginning at age 74.  Fecal occult blood test (FOBT) of stool. / Every year beginning at age 44 and continuing until age 31. You may not have to do this test if you get colonoscopy every 10 years.  Flexible sigmoidoscopy** or colonoscopy.** / Every 5 years for a flexible sigmoidoscopy or every 10 years for a colonoscopy beginning at age 14 and continuing until age 58.  Hepatitis C blood test.** / For all people born from [birth year] through 1963 and any individual with known risks for hepatitis C.  Abdominal aortic aneurysm (AAA) screening.** / A one-time screening for ages 45 to 14 years who are current or former smokers.  Skin self-exam. / Monthly.  Influenza immunization.** / Every year.  Pneumococcal polysaccharide immunization.** / 1 dose at age 38 (or older) if you have never been vaccinated.  Tetanus, diphtheria, pertussis (Tdap, Td) immunization. / A one-time dose of Tdap vaccine if you are over 65 and have contact with an infant, are a Research scientist (physical sciences), or simply want to be protected from whooping cough. After that, you need a Td booster dose every 10 years.  Varicella immunization. ** / Consult your caregiver.  Meningococcal immunization.** / Consult your  caregiver.  Hepatitis A  immunization. ** / Consult your caregiver. 2 doses, 6 to 18 months apart.  Hepatitis B immunization.** / Check with your caregiver. 3 doses, usually over 6 months. **Family history and personal history of risk and conditions may change your caregiver's recommendations. Document Released: 09/24/2001 Document Revised: 10/21/2011 Document Reviewed: 12/24/2010 [facility] Patient Information 2013 [place], [place].      Neta Mends. Panosh M.D.

## 2012-08-18 NOTE — Patient Instructions (Addendum)
Your blood pressure is elevated today  Get a monitor and check readings . We need to add medication if the readings are consistently elevated. Check about daily and record readings  ROV in 1 month with machine and readings .  May need to start medication. Contact us if readings consistently 160 and above  Before then .  Flu vaccine today  Will notify you  of labs when available. Can sigh up for My chart to view results.  Get  [place] report  Or copy of visits to [place] .  How to Take Your Blood Pressure  These instructions are only for electronic home blood pressure machines. You will need:   An automatic or semi-automatic blood pressure machine.  Fresh batteries for the blood pressure machine. HOW DO I USE THESE TOOLS TO CHECK MY BLOOD PRESSURE?   There are 2 numbers that make up your blood pressure. For example: 120/80.  The first number (120 in our example) is called the "systolic pressure." It is a measure of the pressure in your blood vessels when your heart is pumping blood.  The second number (80 in our example) is called the "diastolic pressure." It is a measure of the pressure in your blood vessels when your heart is resting between beats.  Before you buy a home blood pressure machine, check the size of your arm so you can buy the right size cuff. Here is how to check the size of your arm:  Use a tape measure that shows both inches and centimeters.  Wrap the tape measure around the middle upper part of your arm. You may need someone to help you measure right.  Write down your arm measurement in both inches and centimeters.  To measure your blood pressure right, it is important to have the right size cuff.  If your arm is up to 13 inches (37 to 34 centimeters), get an adult cuff size.  If your arm is 13 to 17 inches (35 to 44 centimeters), get a large adult cuff size.  If your arm is 17 to 20 inches (45 to 52 centimeters), get an adult thigh cuff.  Try to rest or relax for  at least 30 minutes before you check your blood pressure.  Do not smoke.  Do not have any drinks with caffeine, such as:  Pop.  Coffee.  Tea.  Check your blood pressure in a quiet room.  Sit down and stretch out your arm on a table. Keep your arm at about the level of your heart. Let your arm relax. GETTING BLOOD PRESSURE READINGS  Make sure you remove any tight-fighting clothing from your arm. Wrap the cuff around your upper arm. Wrap it just above the bend, and above where you felt the pulse. You should be able to slip a finger between the cuff and your arm. If you cannot slip a finger in the cuff, it is too tight and should be removed and rewrapped.  Some units requires you to manually pump up the arm cuff.  Automatic units inflate the cuff when you press a button.  Cuff deflation is automatic in both models.  After the cuff is inflated, the unit measures your blood pressure and pulse. The readings are displayed on a monitor. Hold still and breathe normally while the cuff is inflated.  Getting a reading takes less than a minute.  Some models store readings in a memory. Some provide a printout of readings.  Get readings at different times of the  day. You should wait at least 5 minutes between readings. Take readings with you to your next doctor's visit. Document Released: 07/11/2008 Document Revised: 10/21/2011 Document Reviewed: 07/11/2008 [facility] Patient Information 2013 [place], [place].   Preventive Care for Adults, Male A healthy lifestyle and preventive care can promote health and wellness. Preventive health guidelines for men include the following key practices:  A routine yearly physical is a good way to check with your caregiver about your health and preventative screening. It is a chance to share any concerns and updates on your health, and to receive a thorough exam.  Visit your dentist for a routine exam and preventative care every 6 months. Brush your teeth  twice a day and floss once a day. Good oral hygiene prevents tooth decay and gum disease.  The frequency of eye exams is based on your age, health, family medical history, use of contact lenses, and other factors. Follow your caregiver's recommendations for frequency of eye exams.  Eat a healthy diet. Foods like vegetables, fruits, whole grains, low-fat dairy products, and lean protein foods contain the nutrients you need without too many calories. Decrease your intake of foods high in solid fats, added sugars, and salt. Eat the right amount of calories for you.Get information about a proper diet from your caregiver, if necessary.  Regular physical exercise is one of the most important things you can do for your health. Most adults should get at least 150 minutes of moderate-intensity exercise (any activity that increases your heart rate and causes you to sweat) each week. In addition, most adults need muscle-strengthening exercises on 2 or more days a week.  Maintain a healthy weight. The body mass index (BMI) is a screening tool to identify possible weight problems. It provides an estimate of body fat based on height and weight. Your caregiver can help determine your BMI, and can help you achieve or maintain a healthy weight.For adults 20 years and older:  A BMI below 18.5 is considered underweight.  A BMI of 18.5 to 24.9 is normal.  A BMI of 25 to 29.9 is considered overweight.  A BMI of 30 and above is considered obese.  Maintain normal blood lipids and cholesterol levels by exercising and minimizing your intake of saturated fat. Eat a balanced diet with plenty of fruit and vegetables. Blood tests for lipids and cholesterol should begin at age 47 and be repeated every 5 years. If your lipid or cholesterol levels are high, you are over 50, or you are a high risk for heart disease, you may need your cholesterol levels checked more frequently.Ongoing high lipid and cholesterol levels should be  treated with medicines if diet and exercise are not effective.  If you smoke, find out from your caregiver how to quit. If you do not use tobacco, do not start.  If you choose to drink alcohol, do not exceed 2 drinks per day. One drink is considered to be 12 ounces (355 mL) of beer, 5 ounces (148 mL) of wine, or 1.5 ounces (44 mL) of liquor.  Avoid use of street drugs. Do not share needles with anyone. Ask for help if you need support or instructions about stopping the use of drugs.  High blood pressure causes heart disease and increases the risk of stroke. Your blood pressure should be checked at least every 1 to 2 years. Ongoing high blood pressure should be treated with medicines, if weight loss and exercise are not effective.  If you are 45 to 70  years old, ask your caregiver if you should take aspirin to prevent heart disease.  Diabetes screening involves taking a blood sample to check your fasting blood sugar level. This should be done once every 3 years, after age 69, if you are within normal weight and without risk factors for diabetes. Testing should be considered at a younger age or be carried out more frequently if you are overweight and have at least 1 risk factor for diabetes.  Colorectal cancer can be detected and often prevented. Most routine colorectal cancer screening begins at the age of 30 and continues through age 24. However, your caregiver may recommend screening at an earlier age if you have risk factors for colon cancer. On a yearly basis, your caregiver may provide home test kits to check for hidden blood in the stool. Use of a small camera at the end of a tube, to directly examine the colon (sigmoidoscopy or colonoscopy), can detect the earliest forms of colorectal cancer. Talk to your caregiver about this at age 19, when routine screening begins. Direct examination of the colon should be repeated every 5 to 10 years through age 46, unless early forms of pre-cancerous polyps  or small growths are found.  Hepatitis C blood testing is recommended for all people born from [birth year] through 1965 and any individual with known risks for hepatitis C.  Practice safe sex. Use condoms and avoid high-risk sexual practices to reduce the spread of sexually transmitted infections (STIs). STIs include gonorrhea, chlamydia, syphilis, trichomonas, herpes, HPV, and human immunodeficiency virus (HIV). Herpes, HIV, and HPV are viral illnesses that have no cure. They can result in disability, cancer, and death.  A one-time screening for abdominal aortic aneurysm (AAA) and surgical repair of large AAAs by sound wave imaging (ultrasonography) is recommended for ages 61 to 66 years who are current or former smokers.  Healthy men should no longer receive prostate-specific antigen (PSA) blood tests as part of routine cancer screening. Consult with your caregiver about prostate cancer screening.  Testicular cancer screening is not recommended for adult males who have no symptoms. Screening includes self-exam, caregiver exam, and other screening tests. Consult with your caregiver about any symptoms you have or any concerns you have about testicular cancer.  Use sunscreen with skin protection factor (SPF) of 30 or more. Apply sunscreen liberally and repeatedly throughout the day. You should seek shade when your shadow is shorter than you. Protect yourself by wearing long sleeves, pants, a wide-brimmed hat, and sunglasses year round, whenever you are outdoors.  Once a month, do a whole body skin exam, using a mirror to look at the skin on your back. Notify your caregiver of new moles, moles that have irregular borders, moles that are larger than a pencil eraser, or moles that have changed in shape or color.  Stay current with required immunizations.  Influenza. You need a dose every fall (or winter). The composition of the flu vaccine changes each year, so being vaccinated once is not  enough.  Pneumococcal polysaccharide. You need 1 to 2 doses if you smoke cigarettes or if you have certain chronic medical conditions. You need 1 dose at age 42 (or older) if you have never been vaccinated.  Tetanus, diphtheria, pertussis (Tdap, Td). Get 1 dose of Tdap vaccine if you are younger than age 64 years, are over 39 and have contact with an infant, are a Research scientist (physical sciences), or simply want to be protected from whooping cough. After that, you need a Td  booster dose every 10 years. Consult your caregiver if you have not had at least 3 tetanus and diphtheria-containing shots sometime in your life or have a deep or dirty wound.  HPV. This vaccine is recommended for males 13 through 70 years of age. This vaccine may be given to men 22 through 70 years of age who have not completed the 3 dose series. It is recommended for men through age 29 who have sex with men or whose immune system is weakened because of HIV infection, other illness, or medications. The vaccine is given in 3 doses over 6 months.  Measles, mumps, rubella (MMR). You need at least 1 dose of MMR if you were born in 1957 or later. You may also need a 2nd dose.  Meningococcal. If you are age 68 to 47 years and a Orthoptist living in a residence hall, or have one of several medical conditions, you need to get vaccinated against meningococcal disease. You may also need additional booster doses.  Zoster (shingles). If you are age 68 years or older, you should get this vaccine.  Varicella (chickenpox). If you have never had chickenpox or you were vaccinated but received only 1 dose, talk to your caregiver to find out if you need this vaccine.  Hepatitis A. You need this vaccine if you have a specific risk factor for hepatitis A virus infection, or you simply wish to be protected from this disease. The vaccine is usually given as 2 doses, 6 to 18 months apart.  Hepatitis B. You need this vaccine if you have a specific  risk factor for hepatitis B virus infection or you simply wish to be protected from this disease. The vaccine is given in 3 doses, usually over 6 months. Preventative Service / Frequency Ages 13 to 102  Blood pressure check.** / Every 1 to 2 years.  Lipid and cholesterol check.** / Every 5 years beginning at age 65.  Hepatitis C blood test.** / For any individual with known risks for hepatitis C.  Skin self-exam. / Monthly.  Influenza immunization.** / Every year.  Pneumococcal polysaccharide immunization.** / 1 to 2 doses if you smoke cigarettes or if you have certain chronic medical conditions.  Tetanus, diphtheria, pertussis (Tdap,Td) immunization. / A one-time dose of Tdap vaccine. After that, you need a Td booster dose every 10 years.  HPV immunization. / 3 doses over 6 months, if 26 and younger.  Measles, mumps, rubella (MMR) immunization. / You need at least 1 dose of MMR if you were born in 1957 or later. You may also need a 2nd dose.  Meningococcal immunization. / 1 dose if you are age 35 to 13 years and a Orthoptist living in a residence hall, or have one of several medical conditions, you need to get vaccinated against meningococcal disease. You may also need additional booster doses.  Varicella immunization.** / Consult your caregiver.  Hepatitis A immunization.** / Consult your caregiver. 2 doses, 6 to 18 months apart.  Hepatitis B immunization.** / Consult your caregiver. 3 doses usually over 6 months. Ages 34 to 93  Blood pressure check.** / Every 1 to 2 years.  Lipid and cholesterol check.** / Every 5 years beginning at age 75.  Fecal occult blood test (FOBT) of stool. / Every year beginning at age 26 and continuing until age 66. You may not have to do this test if you get colonoscopy every 10 years.  Flexible sigmoidoscopy** or colonoscopy.** / Every 5 years  for a flexible sigmoidoscopy or every 10 years for a colonoscopy beginning at age 78 and  continuing until age 13.  Hepatitis C blood test.** / For all people born from [birth year] through 1963 and any individual with known risks for hepatitis C.  Skin self-exam. / Monthly.  Influenza immunization.** / Every year.  Pneumococcal polysaccharide immunization.** / 1 to 2 doses if you smoke cigarettes or if you have certain chronic medical conditions.  Tetanus, diphtheria, pertussis (Tdap/Td) immunization.** / A one-time dose of Tdap vaccine. After that, you need a Td booster dose every 10 years.  Measles, mumps, rubella (MMR) immunization. / You need at least 1 dose of MMR if you were born in 1957 or later. You may also need a 2nd dose.  Varicella immunization.**/ Consult your caregiver.  Meningococcal immunization.** / Consult your caregiver.  Hepatitis A immunization.** / Consult your caregiver. 2 doses, 6 to 18 months apart.  Hepatitis B immunization.** / Consult your caregiver. 3 doses, usually over 6 months. Ages 24 and over  Blood pressure check.** / Every 1 to 2 years.  Lipid and cholesterol check.**/ Every 5 years beginning at age 29.  Fecal occult blood test (FOBT) of stool. / Every year beginning at age 22 and continuing until age 47. You may not have to do this test if you get colonoscopy every 10 years.  Flexible sigmoidoscopy** or colonoscopy.** / Every 5 years for a flexible sigmoidoscopy or every 10 years for a colonoscopy beginning at age 36 and continuing until age 39.  Hepatitis C blood test.** / For all people born from [birth year] through 1963 and any individual with known risks for hepatitis C.  Abdominal aortic aneurysm (AAA) screening.** / A one-time screening for ages 54 to 21 years who are current or former smokers.  Skin self-exam. / Monthly.  Influenza immunization.** / Every year.  Pneumococcal polysaccharide immunization.** / 1 dose at age 55 (or older) if you have never been vaccinated.  Tetanus, diphtheria, pertussis (Tdap, Td) immunization. / A  one-time dose of Tdap vaccine if you are over 65 and have contact with an infant, are a Research scientist (physical sciences), or simply want to be protected from whooping cough. After that, you need a Td booster dose every 10 years.  Varicella immunization. ** / Consult your caregiver.  Meningococcal immunization.** / Consult your caregiver.  Hepatitis A immunization. ** / Consult your caregiver. 2 doses, 6 to 18 months apart.  Hepatitis B immunization.** / Check with your caregiver. 3 doses, usually over 6 months. **Family history and personal history of risk and conditions may change your caregiver's recommendations. Document Released: 09/24/2001 Document Revised: 10/21/2011 Document Reviewed: 12/24/2010 [facility] Patient Information 2013 [place], [place].

## 2012-08-26 ENCOUNTER — Encounter: Payer: Self-pay | Admitting: Family Medicine

## 2012-09-18 ENCOUNTER — Encounter: Payer: Self-pay | Admitting: Internal Medicine

## 2012-09-18 ENCOUNTER — Ambulatory Visit (INDEPENDENT_AMBULATORY_CARE_PROVIDER_SITE_OTHER): Payer: Medicare Other | Admitting: Internal Medicine

## 2012-09-18 VITALS — BP 156/70 | HR 92 | Temp 98.1°F | Wt 227.0 lb

## 2012-09-18 DIAGNOSIS — E119 Type 2 diabetes mellitus without complications: Secondary | ICD-10-CM | POA: Insufficient documentation

## 2012-09-18 DIAGNOSIS — I10 Essential (primary) hypertension: Secondary | ICD-10-CM | POA: Insufficient documentation

## 2012-09-18 DIAGNOSIS — K7460 Unspecified cirrhosis of liver: Secondary | ICD-10-CM

## 2012-09-18 DIAGNOSIS — I708 Atherosclerosis of other arteries: Secondary | ICD-10-CM

## 2012-09-18 DIAGNOSIS — D696 Thrombocytopenia, unspecified: Secondary | ICD-10-CM | POA: Insufficient documentation

## 2012-09-18 MED ORDER — FUROSEMIDE 40 MG PO TABS: 40.0000 mg | ORAL_TABLET | Freq: Every day | ORAL | Status: DC

## 2012-09-18 MED ORDER — LISINOPRIL 10 MG PO TABS: 10.0000 mg | ORAL_TABLET | Freq: Every day | ORAL | Status: DC

## 2012-09-18 NOTE — Patient Instructions (Signed)
Blood pressure needs to be better checked her readings about 3 times a week. Begin low-dose ACE inhibitor lisinopril once a day when he first started you can feel lightheaded but that it should get better.  goal is to get the blood pressure below 140/90.  Contact our office if there is a problem  Taking the medicine.  You were also noted to have some calcium in your arteries were negative scan at [facility]. Most of the time that means there are coronary artery atherosclerosis and you may benefit from a cholesterol medicine. These medicines are metabolized through the liver but usually do not cause liver disease. We may contact your specialists to see if it is reasonable to begin you on one of these medicines if you're willing to take it.  Your blood sugars are now in the diabetic range but as we discussed you can do a good job of decreasing sugar drinks increasing exercise to bring this down.  We should check laboratory studies A1c in about 3 months and followup visit.  After starting the new blood pressure medication however we should get a chemistry panel BMP don't need an office visit for this if your blood pressure readings are good. Contact our office if blood pressure is not improving.

## 2012-09-18 NOTE — Progress Notes (Signed)
Chief Complaint  Patient presents with  . Follow-up  . Hypertension    Labs    HPI: Patient comes in today for follow up of  multiple medical problems.  Patient comes in today with daughter interpreting for followup of a number of problems but mostly elevated blood pressure. Since his last visit he has tried to take some readings most days and change his lifestyle.  Most of his readings are between 145 and 155. There is some slightly higher and slightly lower diastolic is in the 50-60 range pulse in the 70s 80s. No major change in his health since last time he is only taking Lasix and not on the other medications in the past. ROS: See pertinent positives and negatives per HPI. Denies chest pain shortness of breath currently. Edema is down most of the time.  Habits he does drink a glass of juice today and perhaps one soda thinks that the diet sodas a bad for him and he usually at least one sweet tea.  To see specialist at [facility] in the spring about his liver disease and ? Scan surveillance  No records yet.   Past Medical History  Diagnosis Date  . Migraine   . Hyperlipidemia   . History of chickenpox     Family History  Problem Relation Age of Onset  . Hearing loss      2 brothers   . Stroke      3 brothers ages 44 and another in his 42s.  . Alcohol abuse Father     Died age 48 from complications of respiratory exposure.  . Diabetes Mother   . Diabetes Brother   . Osteoarthritis Mother     History   Social History  . Marital Status: Married    Spouse Name: N/A    Number of Children: N/A  . Years of Education: N/A   Social History Main Topics  . Smoking status: Never Smoker   . Smokeless tobacco: Never Used  . Alcohol Use: Yes     Comment: rarely   . Drug Use: None  . Sexually Active: None   Other Topics Concern  . 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 furosemide (LASIX) 40 MG tablet Take 1 tablet (40 mg total) by mouth daily.  30 tablet  3  . [DISCONTINUED] furosemide (LASIX) 40 MG tablet Take 40 mg by mouth daily.       . [DISCONTINUED] GENERLAC 10 GM/15ML SOLN TAKE 30 ML BY MOUTH EVERY DAY AS DIRECTED . TAKE MORE OR LESS TO ACHIEVE 2 TO 3 LOOSE STOOLS PER DAY  900 mL  0  . [DISCONTINUED] hyoscyamine (LEVBID) 0.375 MG 12 hr tablet Take 0.375 mg by mouth every 12 (twelve) hours as needed. Before evening meal      . [DISCONTINUED] nadolol (CORGARD) 20 MG tablet Take 20 mg by mouth daily.       . [DISCONTINUED] spironolactone (ALDACTONE) 50 MG tablet Take 1 tablet (50 mg total) by mouth 2 (two) times daily.  60 tablet  1  . lisinopril (PRINIVIL,ZESTRIL) 10 MG tablet Take 1 tablet (10 mg  total) by mouth daily.  30 tablet  6    EXAM:  BP 156/70  Pulse 92  Temp 98.1 F (36.7 C) (Oral)  Wt 227 lb (102.967 kg)  SpO2 98%  There is no height on file to calculate BMI. Wt Readings from Last 3 Encounters:  09/18/12 227 lb (102.967 kg)  08/18/12 226 lb (102.513 kg)  11/01/11 229 lb (103.874 kg)    GENERAL: vitals reviewed and listed above, alert, oriented, appears well hydrated and in no acute distress  HEENT: atraumatic, conjunctiva  clear, no jaundice noted no obvious abnormalities on inspection of external nose and ears NECK: no obvious masses on inspection palpation   CV: HRRR, no clubbing cyanosis trc  peripheral edema nl cap refill   MS: moves all extremities without noticeable focal  abnormality  PSYCH: pleasant and cooperative, no obvious depression or anxiety Lab Results  Component Value Date   WBC 5.6 08/18/2012   HGB 13.4 08/18/2012   HCT 38.8* 08/18/2012   PLT 79.0* 08/18/2012   GLUCOSE 128* 08/18/2012   CHOL 178 08/18/2012   TRIG  108.0 08/18/2012   HDL 54.60 08/18/2012   LDLCALC 102* 08/18/2012   ALT 26 08/18/2012   AST 34 08/18/2012   NA 137 08/18/2012   K 3.8 08/18/2012   CL 105 08/18/2012   CREATININE 1.0 08/18/2012   BUN 11 08/18/2012   CO2 25 08/18/2012   TSH 2.84 08/18/2012   PSA 0.62 03/29/2008   INR 1.27 11/01/2011   HGBA1C 7.4* 08/18/2012    ASSESSMENT AND PLAN:  Discussed the following assessment and plan:  1. Hypertension    Currently not controlled on Lasix for edema will have low dose ACE inhibitor risk-benefit discussed he is not currently taking spironolact check bmp in the next  2. ELEVATED BLOOD PRESSURE   3. Diabetes mellitus, new onset    bo sx pos fam hx   4. Cirrhosis of liver    Followed at [facility] no recent notes to review to be seen rescan in the next few months.  5. Atherosclerosis of arteries    Incidental finding on his abdominal liver scan at [facility]. Discussed implication. Sitter add statin medicine but assess risk, prefers no new docs for now;No sx    Reviewed at length the new diagnosis of diabetes based on his A1c test. Discussed options he thinks he can do quite well with lifestyle changes with exercise and dietary changes we went over his sugar drinks on a hard time getting up his sweet tea but will work on it. At this time it is reasonable to use lifestyle intervention and followup in about 3 months. Pt feels no need for referral to dietician at this time.  After pt left  reviewed ehr ? If had pneumococcal vaccine ?t [facility] and colonoscopy    ASK  on return ;assumed these were done but not in EHR. Also assumed he had hep vaccine but not certain needs documentation if done.  -Patient advised to return or notify health care team  if symptoms worsen or persist or new concerns arise.  Patient Instructions  Blood pressure needs to be better checked her readings about 3 times a week. Begin low-dose ACE inhibitor lisinopril once a day when he first started you can feel lightheaded but that it should get  better.  goal is to get the blood pressure below 140/90.  Contact our office if there is a problem  Taking the medicine.  You were also noted to have  some calcium in your arteries were negative scan at [facility]. Most of the time that means there are coronary artery atherosclerosis and you may benefit from a cholesterol medicine. These medicines are metabolized through the liver but usually do not cause liver disease. We may contact your specialists to see if it is reasonable to begin you on one of these medicines if you're willing to take it.  Your blood sugars are now in the diabetic range but as we discussed you can do a good job of decreasing sugar drinks increasing exercise to bring this down.  We should check laboratory studies A1c in about 3 months and followup visit.  After starting the new blood pressure medication however we should get a chemistry panel BMP don't need an office visit for this if your blood pressure readings are good. Contact our office if blood pressure is not improving.   Neta Mends. Kerah Hardebeck M.D.

## 2012-10-16 ENCOUNTER — Other Ambulatory Visit: Payer: Medicare Other

## 2012-12-17 ENCOUNTER — Other Ambulatory Visit (INDEPENDENT_AMBULATORY_CARE_PROVIDER_SITE_OTHER): Payer: Medicare Other

## 2012-12-17 DIAGNOSIS — I10 Essential (primary) hypertension: Secondary | ICD-10-CM

## 2012-12-17 DIAGNOSIS — R739 Hyperglycemia, unspecified: Secondary | ICD-10-CM

## 2012-12-17 DIAGNOSIS — R7309 Other abnormal glucose: Secondary | ICD-10-CM

## 2012-12-17 LAB — HEPATIC FUNCTION PANEL
ALT: 33 U/L (ref 0–53)
AST: 37 U/L (ref 0–37)
Bilirubin, Direct: 0.4 mg/dL — ABNORMAL HIGH (ref 0.0–0.3)
Total Bilirubin: 1.6 mg/dL — ABNORMAL HIGH (ref 0.3–1.2)

## 2012-12-17 LAB — BASIC METABOLIC PANEL
CO2: 26 mEq/L (ref 19–32)
Calcium: 8.5 mg/dL (ref 8.4–10.5)
Chloride: 105 mEq/L (ref 96–112)
Sodium: 138 mEq/L (ref 135–145)

## 2012-12-17 LAB — HEMOGLOBIN A1C: Hgb A1c MFr Bld: 7.1 % — ABNORMAL HIGH (ref 4.6–6.5)

## 2012-12-18 ENCOUNTER — Other Ambulatory Visit: Payer: Medicare Other

## 2012-12-25 ENCOUNTER — Ambulatory Visit: Payer: Medicare Other | Admitting: Internal Medicine

## 2013-01-06 ENCOUNTER — Ambulatory Visit (INDEPENDENT_AMBULATORY_CARE_PROVIDER_SITE_OTHER): Payer: Medicare Other | Admitting: Internal Medicine

## 2013-01-06 ENCOUNTER — Encounter: Payer: Self-pay | Admitting: Internal Medicine

## 2013-01-06 VITALS — BP 140/72 | HR 91 | Temp 98.5°F | Wt 218.0 lb

## 2013-01-06 DIAGNOSIS — E119 Type 2 diabetes mellitus without complications: Secondary | ICD-10-CM

## 2013-01-06 DIAGNOSIS — E785 Hyperlipidemia, unspecified: Secondary | ICD-10-CM

## 2013-01-06 DIAGNOSIS — H9190 Unspecified hearing loss, unspecified ear: Secondary | ICD-10-CM

## 2013-01-06 DIAGNOSIS — K7460 Unspecified cirrhosis of liver: Secondary | ICD-10-CM

## 2013-01-06 DIAGNOSIS — I10 Essential (primary) hypertension: Secondary | ICD-10-CM

## 2013-01-06 MED ORDER — LISINOPRIL 20 MG PO TABS: 20.0000 mg | ORAL_TABLET | Freq: Every day | ORAL | Status: DC

## 2013-01-06 NOTE — Patient Instructions (Addendum)
Increase  bp medication to 20 mg per. Day  .  Can couble up on the 10 mg  Lisinopril in the meantime   Still dont have the information from [facility].  We will contact them about    ? We have about  Medication and vaccines.   Please  Eliminate sweet drinks and lots of simple carbohydrates to get you sugars down .   This can avoid having to take medication for this  3 more months  And recheck labs aagin about this and then ROV.

## 2013-01-06 NOTE — Progress Notes (Signed)
Chief Complaint  Patient presents with  . Follow-up    HPI: Patient comes in today for follow up of  multiple medical problems. He is here with his daughter he was signing for him they both feel comfortable in this situation. Since his last visit he hasn't really changed his diet that much could do better. He is taking a blood pressure medicine once a day without side effects. Hasn't really checked his blood pressure readings swelling in his legs is slight not too bad no worse notices when he pulls his socks down.  No numbness falling change in appetite. No major changes in his vision. He is still being seen at [facility] Dr. Rudean Hitt had a recent CT scan and because of findings they're going to repeat the endoscopy patient instead of next year because things are slightly worse. Still do not have any records from [facility] except the initial visit in 01-01-12.  He thinks he had 2 hepatitis vaccines at [facility] ROS: See pertinent positives and negatives per HPI. No chest pain shortness of breath.  Past Medical History  Diagnosis Date  . Migraine   . Hyperlipidemia   . History of chickenpox     Family History  Problem Relation Age of Onset  . Hearing loss      2 brothers   . Stroke      3 brothers ages 21 and another in his 80s.  . Alcohol abuse Father     Died age 14 from complications of respiratory exposure.  . Diabetes Mother   . Diabetes Brother   . Osteoarthritis Mother     History   Social History  . Marital Status: Married    Spouse Name: N/A    Number of Children: N/A  . Years of Education: N/A   Social History Main Topics  . Smoking status: Never Smoker   . Smokeless tobacco: Never Used  . Alcohol Use: Yes     Comment: rarely   . Drug Use: None  . Sexually Active: None   Other Topics Concern  . None   Social History Narrative   Widowed  2 year   Became deaf at age 65   Retired from the [employer]   Neg tad remote tobacco  Age 21- 49    HH o f 3  No pets  Daughter with downs died last year Jan 01, 2012    6 hours sleep       Neg ets fa  etoh     Upper dentures     Outpatient Encounter Prescriptions as of 01/06/2013  Medication Sig Dispense Refill  . furosemide (LASIX) 40 MG tablet Take 1 tablet (40 mg total) by mouth daily.  30 tablet  3  . [DISCONTINUED] lisinopril (PRINIVIL,ZESTRIL) 10 MG tablet Take 1 tablet (10 mg total) by mouth daily.  30 tablet  6  . lisinopril (PRINIVIL,ZESTRIL) 20 MG tablet Take 1 tablet (20 mg total) by mouth daily.  90 tablet  3   No facility-administered encounter medications on file as of 01/06/2013.    EXAM:  BP 140/72  Pulse 91  Temp(Src) 98.5 F (36.9 C) (Oral)  Wt 218 lb (98.884 kg)  BMI 29.56 kg/m2  SpO2 97%  Body mass index is 29.56 kg/(m^2).  GENERAL: vitals reviewed and listed above, alert, oriented, appears well hydrated and in no acute distress  HEENT: atraumatic, conjunctiva  clear, no obvious abnormalities on inspection of external nose and ears  NECK: no obvious masses  on inspection palpation no adenopathy no obvious JVD  LUNGS: clear to auscultation bilaterally, no wheezes, rales or rhonchi, good air movement  CV: HRRR, no clubbing cyanosis slight +1 peripheral edema nl cap refill skin intact  MS: moves all extremities without noticeable focal  abnormality  PSYCH: pleasant and cooperative, no obvious depression or anxiety Lab Results  Component Value Date   WBC 5.6 08/18/2012   HGB 13.4 08/18/2012   HCT 38.8* 08/18/2012   PLT 79.0* 08/18/2012   GLUCOSE 157* 12/17/2012   CHOL 178 08/18/2012   TRIG 108.0 08/18/2012   HDL 54.60 08/18/2012   LDLCALC 102* 08/18/2012   ALT 33 12/17/2012   AST 37 12/17/2012   NA 138 12/17/2012   K 3.8 12/17/2012   CL 105 12/17/2012   CREATININE 1.2 12/17/2012   BUN 13 12/17/2012   CO2 26 12/17/2012   TSH 2.84 08/18/2012   PSA 0.62 03/29/2008   INR 1.27 11/01/2011   HGBA1C 7.1* 12/17/2012   Wt Readings from Last 3 Encounters:  01/06/13 218 lb (98.884 kg)  09/18/12  227 lb (102.967 kg)  08/18/12 226 lb (102.513 kg)    ASSESSMENT AND PLAN:  Discussed the following assessment and plan:  HYPERLIPIDEMIA - avoiding statin for now because of liver situation   Diabetes mellitus, new onset - slightly better   avoiding meds so far has rrom to do dietary changes if wishes tea and sodas etc  consdier med if still an issue next time   Cirrhosis of liver - still no info from [facility] will have to contat their office about  status and notes.   Unspecified essential hypertension - still slightly up  increase to 20 per day acei  and fu 3 months    DEAFNESS, CONGENITAL stil no records to review     we should call the [facility] office and get records to review sign may contact them about management. His other disease states. He has calcium deposits on the CT scan on coronary arteries would be best on a statin but because of his liver disease or not begin this without further input. -Patient advised to return or notify health care team  if symptoms worsen or persist or new concerns arise.  Patient Instructions  Increase  bp medication to 20 mg per. Day  .  Can couble up on the 10 mg  Lisinopril in the meantime   Still dont have the information from [facility].  We will contact them about    ? We have about  Medication and vaccines.   Please  Eliminate sweet drinks and lots of simple carbohydrates to get you sugars down .   This can avoid having to take medication for this  3 more months  And recheck labs aagin about this and then ROV.    Neta Mends. Briselda Naval M.D.

## 2013-02-12 ENCOUNTER — Other Ambulatory Visit: Payer: Self-pay | Admitting: Internal Medicine

## 2013-04-06 ENCOUNTER — Other Ambulatory Visit (INDEPENDENT_AMBULATORY_CARE_PROVIDER_SITE_OTHER): Payer: Medicare Other

## 2013-04-06 DIAGNOSIS — E119 Type 2 diabetes mellitus without complications: Secondary | ICD-10-CM

## 2013-04-06 LAB — BASIC METABOLIC PANEL
BUN: 13 mg/dL (ref 6–23)
Creatinine, Ser: 1.4 mg/dL (ref 0.4–1.5)
GFR: 54.64 mL/min — ABNORMAL LOW (ref >60.00)
Potassium: 4 mEq/L (ref 3.5–5.1)

## 2013-04-06 LAB — HEMOGLOBIN A1C: Hgb A1c MFr Bld: 6.7 % — ABNORMAL HIGH (ref 4.6–6.5)

## 2013-04-13 ENCOUNTER — Ambulatory Visit (INDEPENDENT_AMBULATORY_CARE_PROVIDER_SITE_OTHER): Payer: Medicare Other | Admitting: Internal Medicine

## 2013-04-13 ENCOUNTER — Encounter: Payer: Self-pay | Admitting: Internal Medicine

## 2013-04-13 VITALS — BP 158/80 | HR 84 | Temp 98.0°F | Wt 219.0 lb

## 2013-04-13 DIAGNOSIS — I10 Essential (primary) hypertension: Secondary | ICD-10-CM

## 2013-04-13 DIAGNOSIS — E119 Type 2 diabetes mellitus without complications: Secondary | ICD-10-CM

## 2013-04-13 DIAGNOSIS — Z23 Encounter for immunization: Secondary | ICD-10-CM

## 2013-04-13 DIAGNOSIS — I708 Atherosclerosis of other arteries: Secondary | ICD-10-CM

## 2013-04-13 DIAGNOSIS — K7460 Unspecified cirrhosis of liver: Secondary | ICD-10-CM

## 2013-04-13 MED ORDER — LISINOPRIL 40 MG PO TABS: 40.0000 mg | ORAL_TABLET | Freq: Every day | ORAL | Status: DC

## 2013-04-13 NOTE — Progress Notes (Signed)
Chief Complaint  Patient presents with  . Follow-up  . Hypertension  . Diabetes    HPI: Patient comes in today for follow up of  multiple medical problems.  Patient here with his daughter who translates. Signs   Since his last visit he states he is doing well and he feels fine. He did see the liver specialist at [facility] and had a recent MRI that apparently was stable. We still don't receive any communication from your office.  He is taking blood pressure medicine and his Lasix but doesn't really check his readings can sees a bit busy.  Doesn't check his sugars tries to decrease sweets but still does this week he. Family tries to encourage him to not do this.  Declines the flu vaccine today but will take the pneumococcal vaccine.   Prevnar recommended. ROS: See pertinent positives and negatives per HPI.  Past Medical History  Diagnosis Date  . Migraine   . Hyperlipidemia   . History of chickenpox     Family History  Problem Relation Age of Onset  . Hearing loss      2 brothers   . Stroke      3 brothers ages 4 and another in his 45s.  . Alcohol abuse Father     Died age 79 from complications of respiratory exposure.  . Diabetes Mother   . Diabetes Brother   . Osteoarthritis Mother     History   Social History  . Marital Status: Married    Spouse Name: N/A    Number of Children: N/A  . Years of Education: N/A   Social History Main Topics  . Smoking status: Never Smoker   . Smokeless tobacco: Never Used  . Alcohol Use: Yes     Comment: rarely   . Drug Use: None  . Sexual Activity: None   Other Topics Concern  . None   Social History Narrative   Widowed  2 year   Became deaf at age 33   Retired from the [employer]   Neg tad remote tobacco  Age 23- 48    HH o f 3  No pets  Daughter with downs died last year Jan 15, 2012    6 hours sleep       Neg ets fa  etoh     Upper dentures     Outpatient Encounter Prescriptions as of 04/13/2013    Medication Sig Dispense Refill  . furosemide (LASIX) 40 MG tablet TAKE 1 TABLET BY MOUTH DAILY  30 tablet  5  . lisinopril (PRINIVIL,ZESTRIL) 40 MG tablet Take 1 tablet (40 mg total) by mouth daily.  90 tablet  3  . [DISCONTINUED] lisinopril (PRINIVIL,ZESTRIL) 20 MG tablet Take 1 tablet (20 mg total) by mouth daily.  90 tablet  3  . [DISCONTINUED] furosemide (LASIX) 40 MG tablet Take 40 mg by mouth daily.       No facility-administered encounter medications on file as of 04/13/2013.    EXAM:  BP 158/80  Pulse 84  Temp(Src) 98 F (36.7 C) (Oral)  Wt 219 lb (99.338 kg)  BMI 29.7 kg/m2  SpO2 97%  Body mass index is 29.7 kg/(m^2).  GENERAL: vitals reviewed and listed above, alert, oriented, appears well hydrated and in no acute distress HEENT: atraumatic, conjunctiva  clear, no obvious abnormalities on inspection of external nose and ears  NECK: no obvious masses on inspection palpation  LUNGS: clear to auscultation bilaterally, no wheezes, rales or rhonchi, good  air movement CV: HRRR, no clubbing cyanosis trc peripheral edema nl cap refill  MS: moves all extremities without noticeable focal  abnormality PSYCH: pleasant and cooperative, no obvious depression or anxiety Diabetic foot exam  Lab Results  Component Value Date   WBC 5.6 08/18/2012   HGB 13.4 08/18/2012   HCT 38.8* 08/18/2012   PLT 79.0* 08/18/2012   GLUCOSE 101* 04/06/2013   CHOL 178 08/18/2012   TRIG 108.0 08/18/2012   HDL 54.60 08/18/2012   LDLCALC 102* 08/18/2012   ALT 33 12/17/2012   AST 37 12/17/2012   NA 136 04/06/2013   K 4.0 04/06/2013   CL 106 04/06/2013   CREATININE 1.4 04/06/2013   BUN 13 04/06/2013   CO2 23 04/06/2013   TSH 2.84 08/18/2012   PSA 0.62 03/29/2008   INR 1.27 11/01/2011   HGBA1C 6.7* 04/06/2013    ASSESSMENT AND PLAN:  Discussed the following assessment and plan:  Diabetes mellitus, new onset  Hypertension  Atherosclerosis of arteries  Need for prophylactic vaccination against Streptococcus pneumoniae  (pneumococcus) - Plan: Pneumococcal conjugate vaccine 13-valent less than 5yo IM Improved control pulses good hypertension not as good increase lisinopril to 40 mg may not be enough may have to add a calcium channel blocker. First minimal medications he has a callus on his left foot has been there for years pre-diabetic discussed care followup podiatry check if needed. Currently does not look high risk. No evidence of vascular compromise seen peripheral disease or neuropathy Reviewed care every where,  found his last CT scan and colonoscopy may have had one Twinrix.  Transaminases are normal INR is barely elevated we'll have to contact his GI doctor about advisability of a statin medicine and then try to convince him to take it. If approved. -Patient advised to return or notify health care team  if symptoms worsen or persist or new concerns arise.  Patient Instructions  Blood pressure is up today  Blood sugar is now in the acceptable range . Increase lisinopril to 40 mg per day  I will send in rx. Check  BMP chemistry in one month( you do not have to have office visit for this just labs to make sure potassium is ok ) Avoid sweet tea and drinks . I will try again to get info from [facility] about other immunizations  consdir statin medications Can soak callus   Check ffet every day if painful red or ulcer need to see [place] or a podiatrist.    How to Avoid Diabetes Problems You can do a lot to prevent or slow down diabetes problems. Following your diabetes plan and taking care of yourself can reduce your risk of serious or life-threatening complications. Below, you will find certain things you can do to prevent diabetes problems. MANAGE YOUR DIABETES Follow your caregiver's, nurse educator's, and dietitian's instructions for managing your diabetes. They will teach you the basics of diabetes care. They can help answer questions you may have. Learn about diabetes and make healthy choices regarding eating  and physical activity. Monitor your blood glucose level regularly. Your caregiver will help you decide how often to check your blood glucose level depending on your treatment goals and how well you are meeting them.  DO NOT SMOKE Smoking and diabetes are a dangerous combination. Smoking raises your risk for diabetes problems. If you quit smoking, you will lower your risk for heart attack, stroke, nerve disease, and kidney disease. Your cholesterol and your blood pressure levels may improve. Your blood  circulation will also improve. If you smoke, ask your caregiver for help in quitting. KEEP YOUR BLOOD PRESSURE UNDER CONTROL Keeping your blood pressure under control will help prevent damage to your eyes, kidneys, heart, and blood vessels. Blood pressure consists of two numbers. The top number should be below 120, and the bottom number should be below 80 (120/80). Keep your blood pressure as close to these numbers as you can. If you already have kidney disease, you may want even lower blood pressure to protect your kidneys. Talk to your caregiver to make sure that your blood pressure goal is right for your needs. Meal planning, medicines, and exercise can help you reach your blood pressure target. Have your blood pressure checked at every visit with your caregiver. KEEP YOUR CHOLESTEROL UNDER CONTROL Normal cholesterol levels will help prevent heart disease and stroke. These are the biggest health problems for people with diabetes. Keeping cholesterol levels under control can also help with blood flow. Have your cholesterol level checked at least once a year. Meal planning, exercise, and medicines can help you reach your cholesterol targets. SCHEDULE AND KEEP YOUR ANNUAL PHYSICAL EXAMS AND EYE EXAMS Your caregiver will tell you how often he or she wants to see you depending on your plan of treatment. It is important that you keep these appointments so that possible problems can be identified early and  complications can be avoided or treated.  Every visit with your caregiver should include your weight, blood pressure, and an evaluation of your blood glucose control.  Your hemoglobin A1c should be checked:  At least twice a year if you are at your goal.  Every 3 months if there are changes in treatment.  If you are not meeting your goals.  Your blood lipids should be checked yearly. You should also be checked yearly to see if you have protein in your urine (microalbumin).  Schedule a dilated eye exam if you have type 1 diabetes within 5 years of your diagnosis and then yearly. Schedule a dilated eye exam if you have type 2 diabetes at diagnosis and then yearly. All exams thereafter can be extended to every 2 to 3 years if one or more exams have been normal. KEEP YOUR VACCINES CURRENT The flu vaccine is recommended yearly. The formula for the vaccine changes every year and needs to be updated for the best protection against current viruses. In addition, you should get a vaccination against pneumonia at least once in your life. However, there are some instances where another vaccine is recommended. Check with your caregiver. TAKE CARE OF YOUR FEET  Diabetes may cause you to have a poor blood supply (circulation) to your legs and feet. Because of this, the skin may be thinner, break easier, and heal more slowly. You also may have nerve damage in your legs and feet causing decreased feeling. You may not notice minor injuries to your feet that could lead to serious problems or infections. Taking care of your feet is very important. Visual foot exams are performed at every routine medical visit. The exams check for cuts, injuries, or other problems with the feet. A comprehensive foot exam should be done yearly. This includes visual inspection as well as assessing foot pulses and testing for loss of sensation. You should also do the following:  Inspect your feet daily for cuts, calluses, blisters,  ingrown toenails, and signs of infection, such as redness, swelling, or pus.  Wash and dry your feet thoroughly, especially between the toes.  Avoid soaking your feet regularly in hot water baths.  Moisturize dry skin with lotion, avoiding areas between your toes.  Cut toenails straight across and file the edges.  Avoid shoes that do not fit well or have areas that irritate your skin.  Avoid going barefooted or wearing only socks. Your feet need protection. TAKE CARE OF YOUR TEETH People with poorly controlled diabetes are more likely to have gum (periodontal) disease. These infections make diabetes harder to control. Periodontal diseases, if left untreated, can lead to tooth loss. Brush your teeth twice a day, floss, and see your dentist for checkups and cleaning every 6 months, or 2 times a year. ASK YOUR CAREGIVER ABOUT TAKING ASPIRIN Taking aspirin daily is recommended to help prevent cardiovascular disease in people with and without diabetes. Ask your caregiver if this would benefit you and what dose he or she would recommend. DRINK RESPONSIBLY Moderate amounts of alcohol (less than 1 drink per day for adult women and less than 2 drinks per day for adult men) have a minimal effect on blood glucose if ingested with food. It is important to eat food with alcohol to avoid hypoglycemia. People should avoid alcohol if they have a history of alcohol abuse or dependence, if they are pregnant, and if they have liver disease, pancreatitis, advanced neuropathy, or severe hypertriglyceridemia. LESSEN STRESS Living with diabetes can be stressful. When you are under stress, your blood glucose may be affected in two ways:  Stress hormones may cause your blood glucose to rise.  You may be distracted from taking good care of yourself. It is a good idea to be aware of your stress level and make changes that are necessary to help you better manage challenging situations. Support groups, planned  relaxation, a hobby you enjoy, meditation, healthy relationships, and exercise all work to lower your stress level. If your efforts do not seem to be helping, get help from your caregiver or a trained mental health professional. Document Released: 04/16/2011 Document Revised: 07/15/2012 Document Reviewed: 04/16/2011 [facility] Patient Information 2014 [place], [place]. Diabetes, Keeping Your Heart and Blood Vessels Healthy Too much glucose (sugar) in the blood for a long period of time can cause problems for those who have diabetes. This high blood glucose can damage many parts of the body, such as the heart, blood vessels, eyes, and kidneys. Heart and blood vessel disease can lead to heart attacks and strokes, which is the leading cause of death for people with diabetes. There are many things you can to do slow down or prevent the complications from diabetes. WHAT DO MY HEART AND BLOOD VESSELS DO? Your heart and blood vessels make up your circulatory system. Your heart is a big muscle that pumps blood through your body. Your heart pumps blood carrying oxygen to large blood vessels (arteries) and small blood vessels (capillaries). Other blood vessels, called veins, carry blood back to the heart. HOW DO MY BLOOD VESSELS GET CLOGGED? Several things, including having diabetes, can make your blood cholesterol level too high. Cholesterol is a substance that is made by the body and used for many important functions. It is also found in some food that comes from animals. When cholesterol is too high, the insides of large blood vessels become narrowed, even clogged. Narrowed and clogged blood vessels make it harder for enough blood to get to all parts of your body. This problem is called atherosclerosis. WHAT CAN HAPPEN WHEN BLOOD VESSELS ARE CLOGGED? When arteries become narrowed and clogged, you may  have heart problems and are at increased risk for heart attack and stroke:  Chest pain (angina) causes pain in your  chest, arms, shoulders, or back. You may feel the pain more when your heart beats faster, such as when you exercise. The pain may go away when you rest. You also may feel very weak and sweaty. If you do not get treatment, chest pain may happen more often. If diabetes has damaged the heart nerves, you may not feel the chest pain.  Heart attack. A heart attack happens when a blood vessel in or near the heart becomes blocked. Not enough blood can get to that part of the heart muscle so the area becomes oxygen deprived and the heart muscle may be permanently damaged. WHAT ARE THE WARNING SIGNS OF A HEART ATTACK? You may have one or more of the following warning signs:  Chest pain or discomfort.  Pain or discomfort in your arms, back, jaw, or neck.  Indigestion or stomach pain.  Shortness of breath.  Sweating.  Nausea or vomiting.  Light-headedness.  You may have no warning signs at all, or they may come and go. HOW DOES HEART DISEASE CAUSE HIGH BLOOD PRESSURE? Narrowed blood vessels leave a smaller opening for blood to flow through. It is like turning on a garden hose and holding your thumb over the opening. The smaller opening makes the water shoot out with more pressure. In the same way, narrowed blood vessels lead to high blood pressure. Other factors, such as kidney problems and being overweight, can also lead to high blood pressure. Many people with diabetes also have high blood pressure. If you have heart, eye, or kidney problems from diabetes, high blood pressure can make them worse. If you have high blood pressure, ask your caregiver how to lower it. Your caregiver may be asked to take blood pressure medicine every day. Some types of blood pressure medicine can also help keep your kidneys healthy. To lower your blood pressure, you may be asked to lose weight; eat more fruits and vegetables; eat less salt and high-sodium foods, such as canned soups, luncheon meats, salty snack foods, and  fast foods; and drink less alcohol. WHAT ARE THE WARNING SIGNS OF A STROKE? A stroke happens when part of your brain is not getting enough blood and stops working. Depending on the part of the brain that is damaged, a stroke can cause:  Sudden weakness or numbness of your face, arm, or leg on one side of your body.  Sudden confusion, trouble talking, or trouble understanding.  Sudden dizziness, loss of balance, or trouble walking.  Sudden trouble seeing in one or both eyes or sudden double vision.  Sudden severe headache. Sometimes, one or more of these warning signs may happen and then disappear. You might be having a "mini-stroke," also called a TIA (transient ischemic attack). If you have any of these warning signs, tell your caregiver right away. HOW CAN CLOGGED BLOOD VESSELS HURT MY LEGS AND FEET? Peripheral vascular disease can happen when the openings in your blood vessels become narrow and not enough blood gets to your legs and feet. You may feel pain in your buttocks, the back of your legs, or your thighs when you stand, walk, or exercise. Sometimes, surgery is necessary to treat this problem. WHAT CAN I DO TO KEEP MY BLOOD VESSELS HEALTHY AND PREVENT HEART DISEASE AND STROKE?  Keep your blood glucose under control. An A1c blood test will probably be ordered by your caregiver  at least twice a year. The A1c test tells you your average blood glucose for the past 2 to 3 months and can give valuable information on the overall control of your diabetes.  Keep your blood pressure under control. Have it checked at every health care visit. If you are on medication to control blood pressure, take it exactly as prescribed. The target for most people is below 120/80.  Keep your cholesterol under control. Have it checked at least once a year. The targets for most people are:  LDL (bad) cholesterol: below 100 mg/dl.  HDL (good) cholesterol: above 40 mg/dl in men and above 50 mg/dl in  women.  Triglycerides (another type of fat in the blood): below 150 mg/dl.  Make physical activity a part of your daily routine. Aim for at least 30 minutes of exercise most days of the week. Check with your caregiver to learn what activities are best for you.  Make sure that the foods you eat are "heart-healthy." Include foods high in fiber, such as oat bran, oatmeal, whole-grain breads and cereals, fruits, and vegetables. Cut back on foods high in saturated fat or cholesterol, such as meats, butter, dairy products with fat, eggs, shortening, lard, and foods with palm oil or coconut oil.  Maintain a healthy weight. If you are overweight, try to exercise most days of the week. See a registered dietitian for help in planning meals and lowering the fat and calorie content.  If you smoke, QUIT. Your caregiver can tell you about ways to help you quit smoking.  Ask your caregiver whether you should take an aspirin every day. Studies have shown that taking a low dose of aspirin every day can help reduce your risk of heart disease and stroke.  Take your medicines as directed. SEEK MEDICAL CARE IF:   You have any of the warning signs of a heart attack or stroke.  If you have pain in your legs or feet when walking.  If your feet and legs are cool or cold to the touch. FOR MORE INFORMATION   Diabetes educators (nurses, dietitians, pharmacists, and other health professionals).  To find a diabetes educator near you, call the American Association of Diabetes Educators (AADE) toll-free at 1-800-TEAMUP4 502-012-8377), or look on the Internet at www.diabeteseducator.org and click on "Find a Diabetes Educator."  Dietitians.  To find a dietitian near you, call the American Dietetic Association toll-free at (857)359-1963, or look on the Internet at www.eatright.org and click on "Find a Nutrition Professional."  Government.  The National Heart, Lung, and Blood Institute (NHLBI) is part of the  [employer]. To learn more about heart and blood vessel problems, write or call NHLBI Information Center, [address], [place] [address], 5623483394; or see PopSteam.is on the Internet.  To get more information about taking care of diabetes, contact: National Diabetes Information Clearinghouse [address] [place], [place] [address] Phone: ([phone] or [phone] Fax: [phone] Email: ndic@info .StageSync.si Internet: www.diabetes.StageSync.si National Diabetes Education Program [address] [place], [place] [address] Phone: [phone] Fax: [phone] Internet: CommunicationFinder.com.au American Diabetes Association [address] [place], [place] [address] Phone: [phone] Internet: www.diabetes.org [employer] [facility] [address] [place], [place] [address] Phone: [phone] Internet: www.jdrf.org Document Released: 08/01/2003 Document Revised: 07/15/2012 Document Reviewed: 01/06/2009 [facility] Patient Information 2014 [place], [place].      Neta Mends. Karlynn Furrow M.D.  ENDOSCOPIC IMPRESSION: Normal Colonoscopy.  RECOMMENDATIONS: Follow-up with primary care provider and Dr. Chestine Spore in GI  clinic  PATHOLOGY: Pathology - No Specimens Obtained  REPEAT EXAM: Return in 10 year(s) for screening Colonoscopy pending overall  DONE 6 16 14    1. Slight interval increase in focal distal pancreatic tail atrophy and dilatation. No definite mass is visualized by CT; however, consider reevaluation with endoscopic ultrasound given the findings have progressed in the past year. 2. 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 No focal lesion suspicious for hepatocellular carcinoma. 3. Findings suggestive of portal colopathy. 4. Nonspecific stranding and wall thickening of the duodenum with mild stranding adjacent to the pancreatic head. These findings are similar to  prior study and may be chronic; however, acute inflammatory change is not entirely excluded and clinical correlation is suggested. 5. Cholelithiasis. 6. Umbilical hernia which contains fluid. Recommend correlation with physical exam for focal tenderness at this site. 7. Atherosclerosis. 8. Other incidental findings as above. .Vascular: Atherosclerosis without aneurysm. There is infrarenal abdominal aortic ectasia measuring up to 2.4 cm. Similar appearance of wall thickening of the ascending colon with adjacent pericolonic stranding. Two right renal arteries and 2 left renal arteries. Mild narrowing of the celiac axis origin. Atherosclerosis of the SMA with mild narrowing at the origin. Gastric and perisplenic varices. . MSK: Umbilical hernia which contains fluid. Multilevel degenerative changes in the spine .last INR 1.17  In may  Hg 12.2  plt 66    Findings discussed with Dr. Chestine Spore by Dr. Rolla Flatten via telephone on 12/23/2012 at 1647.

## 2013-04-13 NOTE — Patient Instructions (Signed)
Blood pressure is up today  Blood sugar is now in the acceptable range . Increase lisinopril to 40 mg per day  I will send in rx. Check  BMP chemistry in one month( you do not have to have office visit for this just labs to make sure potassium is ok ) Avoid sweet tea and drinks . I will try again to get info from [facility] about other immunizations  consdir statin medications Can soak callus   Check ffet every day if painful red or ulcer need to see [place] or a podiatrist.    How to Avoid Diabetes Problems You can do a lot to prevent or slow down diabetes problems. Following your diabetes plan and taking care of yourself can reduce your risk of serious or life-threatening complications. Below, you will find certain things you can do to prevent diabetes problems. MANAGE YOUR DIABETES Follow your caregiver's, nurse educator's, and dietitian's instructions for managing your diabetes. They will teach you the basics of diabetes care. They can help answer questions you may have. Learn about diabetes and make healthy choices regarding eating and physical activity. Monitor your blood glucose level regularly. Your caregiver will help you decide how often to check your blood glucose level depending on your treatment goals and how well you are meeting them.  DO NOT SMOKE Smoking and diabetes are a dangerous combination. Smoking raises your risk for diabetes problems. If you quit smoking, you will lower your risk for heart attack, stroke, nerve disease, and kidney disease. Your cholesterol and your blood pressure levels may improve. Your blood circulation will also improve. If you smoke, ask your caregiver for help in quitting. KEEP YOUR BLOOD PRESSURE UNDER CONTROL Keeping your blood pressure under control will help prevent damage to your eyes, kidneys, heart, and blood vessels. Blood pressure consists of two numbers. The top number should be below 120, and the bottom number should be below 80 (120/80). Keep your  blood pressure as close to these numbers as you can. If you already have kidney disease, you may want even lower blood pressure to protect your kidneys. Talk to your caregiver to make sure that your blood pressure goal is right for your needs. Meal planning, medicines, and exercise can help you reach your blood pressure target. Have your blood pressure checked at every visit with your caregiver. KEEP YOUR CHOLESTEROL UNDER CONTROL Normal cholesterol levels will help prevent heart disease and stroke. These are the biggest health problems for people with diabetes. Keeping cholesterol levels under control can also help with blood flow. Have your cholesterol level checked at least once a year. Meal planning, exercise, and medicines can help you reach your cholesterol targets. SCHEDULE AND KEEP YOUR ANNUAL PHYSICAL EXAMS AND EYE EXAMS Your caregiver will tell you how often he or she wants to see you depending on your plan of treatment. It is important that you keep these appointments so that possible problems can be identified early and complications can be avoided or treated.  Every visit with your caregiver should include your weight, blood pressure, and an evaluation of your blood glucose control.  Your hemoglobin A1c should be checked:  At least twice a year if you are at your goal.  Every 3 months if there are changes in treatment.  If you are not meeting your goals.  Your blood lipids should be checked yearly. You should also be checked yearly to see if you have protein in your urine (microalbumin).  Schedule a dilated eye exam  if you have type 1 diabetes within 5 years of your diagnosis and then yearly. Schedule a dilated eye exam if you have type 2 diabetes at diagnosis and then yearly. All exams thereafter can be extended to every 2 to 3 years if one or more exams have been normal. KEEP YOUR VACCINES CURRENT The flu vaccine is recommended yearly. The formula for the vaccine changes every year  and needs to be updated for the best protection against current viruses. In addition, you should get a vaccination against pneumonia at least once in your life. However, there are some instances where another vaccine is recommended. Check with your caregiver. TAKE CARE OF YOUR FEET  Diabetes may cause you to have a poor blood supply (circulation) to your legs and feet. Because of this, the skin may be thinner, break easier, and heal more slowly. You also may have nerve damage in your legs and feet causing decreased feeling. You may not notice minor injuries to your feet that could lead to serious problems or infections. Taking care of your feet is very important. Visual foot exams are performed at every routine medical visit. The exams check for cuts, injuries, or other problems with the feet. A comprehensive foot exam should be done yearly. This includes visual inspection as well as assessing foot pulses and testing for loss of sensation. You should also do the following:  Inspect your feet daily for cuts, calluses, blisters, ingrown toenails, and signs of infection, such as redness, swelling, or pus.  Wash and dry your feet thoroughly, especially between the toes.  Avoid soaking your feet regularly in hot water baths.  Moisturize dry skin with lotion, avoiding areas between your toes.  Cut toenails straight across and file the edges.  Avoid shoes that do not fit well or have areas that irritate your skin.  Avoid going barefooted or wearing only socks. Your feet need protection. TAKE CARE OF YOUR TEETH People with poorly controlled diabetes are more likely to have gum (periodontal) disease. These infections make diabetes harder to control. Periodontal diseases, if left untreated, can lead to tooth loss. Brush your teeth twice a day, floss, and see your dentist for checkups and cleaning every 6 months, or 2 times a year. ASK YOUR CAREGIVER ABOUT TAKING ASPIRIN Taking aspirin daily is recommended  to help prevent cardiovascular disease in people with and without diabetes. Ask your caregiver if this would benefit you and what dose he or she would recommend. DRINK RESPONSIBLY Moderate amounts of alcohol (less than 1 drink per day for adult women and less than 2 drinks per day for adult men) have a minimal effect on blood glucose if ingested with food. It is important to eat food with alcohol to avoid hypoglycemia. People should avoid alcohol if they have a history of alcohol abuse or dependence, if they are pregnant, and if they have liver disease, pancreatitis, advanced neuropathy, or severe hypertriglyceridemia. LESSEN STRESS Living with diabetes can be stressful. When you are under stress, your blood glucose may be affected in two ways:  Stress hormones may cause your blood glucose to rise.  You may be distracted from taking good care of yourself. It is a good idea to be aware of your stress level and make changes that are necessary to help you better manage challenging situations. Support groups, planned relaxation, a hobby you enjoy, meditation, healthy relationships, and exercise all work to lower your stress level. If your efforts do not seem to be helping, get help  from your caregiver or a trained mental health professional. Document Released: 04/16/2011 Document Revised: 07/15/2012 Document Reviewed: 04/16/2011 [facility] Patient Information 2014 [place], [place]. Diabetes, Keeping Your Heart and Blood Vessels Healthy Too much glucose (sugar) in the blood for a long period of time can cause problems for those who have diabetes. This high blood glucose can damage many parts of the body, such as the heart, blood vessels, eyes, and kidneys. Heart and blood vessel disease can lead to heart attacks and strokes, which is the leading cause of death for people with diabetes. There are many things you can to do slow down or prevent the complications from diabetes. WHAT DO MY HEART AND BLOOD VESSELS  DO? Your heart and blood vessels make up your circulatory system. Your heart is a big muscle that pumps blood through your body. Your heart pumps blood carrying oxygen to large blood vessels (arteries) and small blood vessels (capillaries). Other blood vessels, called veins, carry blood back to the heart. HOW DO MY BLOOD VESSELS GET CLOGGED? Several things, including having diabetes, can make your blood cholesterol level too high. Cholesterol is a substance that is made by the body and used for many important functions. It is also found in some food that comes from animals. When cholesterol is too high, the insides of large blood vessels become narrowed, even clogged. Narrowed and clogged blood vessels make it harder for enough blood to get to all parts of your body. This problem is called atherosclerosis. WHAT CAN HAPPEN WHEN BLOOD VESSELS ARE CLOGGED? When arteries become narrowed and clogged, you may have heart problems and are at increased risk for heart attack and stroke:  Chest pain (angina) causes pain in your chest, arms, shoulders, or back. You may feel the pain more when your heart beats faster, such as when you exercise. The pain may go away when you rest. You also may feel very weak and sweaty. If you do not get treatment, chest pain may happen more often. If diabetes has damaged the heart nerves, you may not feel the chest pain.  Heart attack. A heart attack happens when a blood vessel in or near the heart becomes blocked. Not enough blood can get to that part of the heart muscle so the area becomes oxygen deprived and the heart muscle may be permanently damaged. WHAT ARE THE WARNING SIGNS OF A HEART ATTACK? You may have one or more of the following warning signs:  Chest pain or discomfort.  Pain or discomfort in your arms, back, jaw, or neck.  Indigestion or stomach pain.  Shortness of breath.  Sweating.  Nausea or vomiting.  Light-headedness.  You may have no warning signs  at all, or they may come and go. HOW DOES HEART DISEASE CAUSE HIGH BLOOD PRESSURE? Narrowed blood vessels leave a smaller opening for blood to flow through. It is like turning on a garden hose and holding your thumb over the opening. The smaller opening makes the water shoot out with more pressure. In the same way, narrowed blood vessels lead to high blood pressure. Other factors, such as kidney problems and being overweight, can also lead to high blood pressure. Many people with diabetes also have high blood pressure. If you have heart, eye, or kidney problems from diabetes, high blood pressure can make them worse. If you have high blood pressure, ask your caregiver how to lower it. Your caregiver may be asked to take blood pressure medicine every day. Some types of blood pressure medicine  can also help keep your kidneys healthy. To lower your blood pressure, you may be asked to lose weight; eat more fruits and vegetables; eat less salt and high-sodium foods, such as canned soups, luncheon meats, salty snack foods, and fast foods; and drink less alcohol. WHAT ARE THE WARNING SIGNS OF A STROKE? A stroke happens when part of your brain is not getting enough blood and stops working. Depending on the part of the brain that is damaged, a stroke can cause:  Sudden weakness or numbness of your face, arm, or leg on one side of your body.  Sudden confusion, trouble talking, or trouble understanding.  Sudden dizziness, loss of balance, or trouble walking.  Sudden trouble seeing in one or both eyes or sudden double vision.  Sudden severe headache. Sometimes, one or more of these warning signs may happen and then disappear. You might be having a "mini-stroke," also called a TIA (transient ischemic attack). If you have any of these warning signs, tell your caregiver right away. HOW CAN CLOGGED BLOOD VESSELS HURT MY LEGS AND FEET? Peripheral vascular disease can happen when the openings in your blood vessels  become narrow and not enough blood gets to your legs and feet. You may feel pain in your buttocks, the back of your legs, or your thighs when you stand, walk, or exercise. Sometimes, surgery is necessary to treat this problem. WHAT CAN I DO TO KEEP MY BLOOD VESSELS HEALTHY AND PREVENT HEART DISEASE AND STROKE?  Keep your blood glucose under control. An A1c blood test will probably be ordered by your caregiver at least twice a year. The A1c test tells you your average blood glucose for the past 2 to 3 months and can give valuable information on the overall control of your diabetes.  Keep your blood pressure under control. Have it checked at every health care visit. If you are on medication to control blood pressure, take it exactly as prescribed. The target for most people is below 120/80.  Keep your cholesterol under control. Have it checked at least once a year. The targets for most people are:  LDL (bad) cholesterol: below 100 mg/dl.  HDL (good) cholesterol: above 40 mg/dl in men and above 50 mg/dl in women.  Triglycerides (another type of fat in the blood): below 150 mg/dl.  Make physical activity a part of your daily routine. Aim for at least 30 minutes of exercise most days of the week. Check with your caregiver to learn what activities are best for you.  Make sure that the foods you eat are "heart-healthy." Include foods high in fiber, such as oat bran, oatmeal, whole-grain breads and cereals, fruits, and vegetables. Cut back on foods high in saturated fat or cholesterol, such as meats, butter, dairy products with fat, eggs, shortening, lard, and foods with palm oil or coconut oil.  Maintain a healthy weight. If you are overweight, try to exercise most days of the week. See a registered dietitian for help in planning meals and lowering the fat and calorie content.  If you smoke, QUIT. Your caregiver can tell you about ways to help you quit smoking.  Ask your caregiver whether you should  take an aspirin every day. Studies have shown that taking a low dose of aspirin every day can help reduce your risk of heart disease and stroke.  Take your medicines as directed. SEEK MEDICAL CARE IF:   You have any of the warning signs of a heart attack or stroke.  If you  have pain in your legs or feet when walking.  If your feet and legs are cool or cold to the touch. FOR MORE INFORMATION   Diabetes educators (nurses, dietitians, pharmacists, and other health professionals).  To find a diabetes educator near you, call the American Association of Diabetes Educators (AADE) toll-free at 1-800-TEAMUP4 4036453603), or look on the Internet at www.diabeteseducator.org and click on "Find a Diabetes Educator."  Dietitians.  To find a dietitian near you, call the American Dietetic Association toll-free at (505)043-8224, or look on the Internet at www.eatright.org and click on "Find a Nutrition Professional."  Government.  The National Heart, Lung, and Blood Institute (NHLBI) is part of the [employer]. To learn more about heart and blood vessel problems, write or call NHLBI Information Center, [id number], [place] [address], [phone]; or see PopSteam.is on the Internet.  To get more information about taking care of diabetes, contact: National Diabetes Information Clearinghouse [address] [place], [place] [address] Phone: [phone] or [phone] Fax: [phone] Email: ndic@info .StageSync.si Internet: www.diabetes.StageSync.si National Diabetes Education Program [address] [place], [place] [address] Phone: [phone] Fax: [phone] Internet: CommunicationFinder.com.au American Diabetes Association [address] [place], [place] [address] Phone: [phone] Internet: www.diabetes.org Juvenile Diabetes Research Foundation Int'l [address] [place], [place] [address] Phone: [phone] Internet:  www.jdrf.org Document Released: 08/01/2003 Document Revised: 07/15/2012 Document Reviewed: 01/06/2009 [facility] Patient Information 2014 [place], [place].

## 2013-05-04 ENCOUNTER — Telehealth: Payer: Self-pay | Admitting: Internal Medicine

## 2013-05-04 NOTE — Telephone Encounter (Signed)
Patient Information:  Caller Name: Okey Regal  Phone: [phone]  Patient: Ronald Jensen, Ronald Jensen  Gender: Male  DOB: August 08, 1941  Age: 72 Years  PCP: Berniece Andreas (Family Practice)  Office Follow Up:  Does the office need to follow up with this patient?: No  Instructions For The Office: N/A  RN Note:  Pt's Lisinopril was increased from 20mg  to 40mg  on 04-13-13 at last OV.  Daughter states, Dad has been more confused in the last week than normal.  Pt stopped in the middle of the road the other day, traffic was beind him, confused didn't know why he did that per Daughter.  Pt is not w/ Caller for complete assessment.  Advised Daughter to go to Pt's house for better assessment.  Call back set up for 1830 on 9-23 for assessment.  Symptoms  Reason For Call & Symptoms: Confusion  Reviewed Health History In EMR: Yes  Reviewed Medications In EMR: Yes  Reviewed Allergies In EMR: Yes  Reviewed Surgeries / Procedures: Yes  Date of Onset of Symptoms: 04/27/2013  Guideline(s) Used:  Neurologic Deficit  No Protocol Available - Sick Adult  Disposition Per Guideline:   Home Care  Reason For Disposition Reached:   Patient's symptoms are safe to treat at home per nursing judgment  Advice Given:  N/A  Patient Will Follow Care Advice:  YES

## 2013-05-05 ENCOUNTER — Telehealth: Payer: Self-pay | Admitting: Internal Medicine

## 2013-05-05 NOTE — Telephone Encounter (Signed)
[facility] Triage Call Report Triage Record Num: [id number] Operator: Maryfrances Bunnell Patient Name: Ronald Jensen Call Date & Time: 05/04/2013 7:43:43PM Patient Phone: [phone] PCP: Patient Gender: Male PCP Fax : Patient DOB: 10/18/1940 Practice Name: [facility] Reason for Call: Caller: Carol/Other; PCP: Berniece Andreas (Family Practice); CB#: [phone]; Call regarding Confusion; Increased episodes of confusion over the last couple of weeks; Daughter talked to him tonight; Patient says he feels fine, no ha's, no stroke s/s; refuses to go in for ct scan; He said he would go to outpatient ct but not to an er; Triaged per Confusion, Disorientation, Agitation Protocol "New or worsening confusion, disorientation, or agitation." Disposition is to see er; patient refuses and feels he is ok; Caller will f/u with office in am 05/05/13 to discuss need for ov. Protocol(s) Used: Confusion, Disorientation, Agitation Recommended Outcome per Protocol: See ED Immediately Reason for Outcome: New or worsening confusion, disorientation, or agitation Care Advice: ~ IMMEDIATE ACTION Tell provider if this individual has recently started, stopped or changed dose of a prescribed, nonprescribed or alternative medication. ~ 09

## 2013-05-06 NOTE — Telephone Encounter (Signed)
Get him in to be seen today if possible

## 2013-05-06 NOTE — Telephone Encounter (Signed)
Left message on voicemail for the pt/daughter to return my call.

## 2013-05-07 ENCOUNTER — Ambulatory Visit (INDEPENDENT_AMBULATORY_CARE_PROVIDER_SITE_OTHER)
Admission: RE | Admit: 2013-05-07 | Discharge: 2013-05-07 | Disposition: A | Discharge status: Home / Self Care | Payer: Medicare Other | Source: Ambulatory Visit | Attending: Family Medicine | Admitting: Family Medicine

## 2013-05-07 ENCOUNTER — Ambulatory Visit (INDEPENDENT_AMBULATORY_CARE_PROVIDER_SITE_OTHER): Payer: Medicare Other | Admitting: Family Medicine

## 2013-05-07 ENCOUNTER — Encounter: Payer: Self-pay | Admitting: Family Medicine

## 2013-05-07 VITALS — BP 158/76 | HR 98 | Temp 98.9°F | Wt 216.0 lb

## 2013-05-07 DIAGNOSIS — F05 Delirium due to known physiological condition: Secondary | ICD-10-CM

## 2013-05-07 DIAGNOSIS — E785 Hyperlipidemia, unspecified: Secondary | ICD-10-CM

## 2013-05-07 DIAGNOSIS — R0989 Other specified symptoms and signs involving the circulatory and respiratory systems: Secondary | ICD-10-CM

## 2013-05-07 DIAGNOSIS — K7460 Unspecified cirrhosis of liver: Secondary | ICD-10-CM

## 2013-05-07 LAB — BASIC METABOLIC PANEL
Calcium: 8.8 mg/dL (ref 8.4–10.5)
Creatinine, Ser: 1.3 mg/dL (ref 0.4–1.5)
GFR: 56.04 mL/min — ABNORMAL LOW (ref >60.00)

## 2013-05-07 LAB — CBC WITH DIFFERENTIAL/PLATELET
Basophils Relative: 0.2 % (ref 0.0–3.0)
Eosinophils Relative: 4.4 % (ref 0.0–5.0)
HCT: 33.8 % — ABNORMAL LOW (ref 39.0–52.0)
Lymphs Abs: 1 10*3/uL (ref 0.7–4.0)
MCV: 95.7 fl (ref 78.0–100.0)
Monocytes Absolute: 0.5 10*3/uL (ref 0.1–1.0)
RBC: 3.53 Mil/uL — ABNORMAL LOW (ref 4.22–5.81)
WBC: 4.8 10*3/uL (ref 4.5–10.5)

## 2013-05-07 LAB — HEPATIC FUNCTION PANEL
Bilirubin, Direct: 0.6 mg/dL — ABNORMAL HIGH (ref 0.0–0.3)
Total Bilirubin: 2.2 mg/dL — ABNORMAL HIGH (ref 0.3–1.2)

## 2013-05-07 NOTE — Telephone Encounter (Signed)
Patient has an appt with SF today at 2 pm.  30 minute slot scheduled per SF.

## 2013-05-07 NOTE — Progress Notes (Signed)
  Subjective:    Patient ID: Ronald Jensen, male    DOB: July 19, 1941, 72 y.o.   MRN: [id number]  HPI Here with his daughter for periods of confusion during the past week. Several family members have observed this and commented on it to each other. They say the patient has times where he has to think about doing simple tasks for a while as if he has forgotten how to do them. He has made comments to them that did not make sense, and his memory seems short at times. One episode occurred while he was driving his car and he pulled over to the side of the road. His passenger asked him what was wrong and he seemed disoriented and confused and he could not tell her what was wrong. This lasted about 15 minutes and then went away. Today his daughter says he seems to be more like his normal self. Ronald denies any knowledge of these spells and says he feels fine. He denies any HA, no vision changes, no slurred speech, no weakness or numbness of the arms or legs. He denies any chest pain or SOB or cough. 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    Review of Systems  Constitutional: Negative.   HENT: Negative.   Eyes: Negative.   Respiratory: Negative.   Cardiovascular: Negative.   Gastrointestinal: Negative.   Neurological: Negative for dizziness, tremors, seizures, syncope, facial asymmetry, speech difficulty, weakness, light-headedness, numbness and headaches.  Psychiatric/Behavioral: Positive for confusion. Negative for hallucinations, behavioral problems, dysphoric mood and agitation. The patient is not nervous/anxious and is not hyperactive.        Objective:   Physical Exam  Constitutional: He is oriented to person, place, and time. He appears well-developed and well-nourished. No distress.  HENT:  Head: Normocephalic and atraumatic.  Eyes: Conjunctivae are normal. Pupils are equal, round, and reactive to light.  Neck: Neck supple. No thyromegaly present.  Cardiovascular: Normal rate, regular rhythm,  normal heart sounds and intact distal pulses.   Pulmonary/Chest: Effort normal. No respiratory distress. He has no wheezes.  He has rales at the right posterior lung base   Abdominal: Soft. Bowel sounds are normal. He exhibits no distension and no mass. There is no tenderness. There is no rebound and no guarding.  Lymphadenopathy:    He has no cervical adenopathy.  Neurological: He is alert and oriented to person, place, and time. He has normal reflexes. No cranial nerve deficit. He exhibits normal muscle tone. Coordination normal.  Psychiatric: He has a normal mood and affect. His behavior is normal. Judgment and thought content normal.          Assessment & Plan:  He has had periods of confusion and subtle mental status changes. With his hx of cirrhosis we need to consider the possibility of hepatic encephalopathy, so we will get labs today including an ammonia level. He has lung crackles without symptoms, and these appear to be new. We will get a CXR today. Other possible etiologies include seizures or TIAs. We will set him up for a brain MRI next week.

## 2013-05-10 NOTE — Progress Notes (Signed)
Quick Note:  I spoke with pt's daughter Okey Regal. ______

## 2013-05-10 NOTE — Progress Notes (Signed)
Quick Note:  I spoke with Okey Regal, pt's daughter. ______

## 2013-05-11 ENCOUNTER — Telehealth: Payer: Self-pay | Admitting: Internal Medicine

## 2013-05-11 DIAGNOSIS — R410 Disorientation, unspecified: Secondary | ICD-10-CM

## 2013-05-11 DIAGNOSIS — K7460 Unspecified cirrhosis of liver: Secondary | ICD-10-CM

## 2013-05-11 DIAGNOSIS — R188 Other ascites: Secondary | ICD-10-CM

## 2013-05-11 NOTE — Telephone Encounter (Signed)
Pt daughter has ? Concerning blood work results.

## 2013-05-12 ENCOUNTER — Other Ambulatory Visit: Payer: Self-pay | Admitting: Family Medicine

## 2013-05-12 ENCOUNTER — Ambulatory Visit: Payer: Medicare Other | Admitting: Family Medicine

## 2013-05-12 NOTE — Telephone Encounter (Addendum)
Ronald Jensen  i reviewed dr Carver Fila note please see if you can find results of mri of brain . Also ask how he is doing ? Uncertain if the liver metabolism ie ammonia elevation is related to the sx he had.   Would like to get neuro opinion asap .  Also will need to get information to [facility] Can consider adding lactulose back to decrease ammonia level if needed.

## 2013-05-12 NOTE — Telephone Encounter (Signed)
Please tell me the results of the mri cause I cant find them a   Misty please see my message sent to you ? Attached to CAN note    about plan of neuro consult .   I have no idea where my note is in the electronic record I can't find it but I know I attached it is an addendum to the: Nurse message .    Anyway in my message I advised a neurological consult as soon as possible to help [place] ascertain the cause of his symptoms.  Assessment of status i.e. how is he doing now ?does he have any new symptoms.    We should contact his liver specialist also at [facility]( i will work on this)  Uncertain if the ammonia level is causing any of his symptoms. But the treatment could be adding lactulose like he was on early in his diagnosis.  But I need all of the above to be done.  Please call daughter and initiate this plan     I can talk to her if needed also.

## 2013-05-12 NOTE — Telephone Encounter (Signed)
Pt's daughter wants to know what can be done to treat his ammonia level?

## 2013-05-13 ENCOUNTER — Other Ambulatory Visit: Payer: Self-pay | Admitting: Internal Medicine

## 2013-05-13 ENCOUNTER — Other Ambulatory Visit: Payer: Self-pay

## 2013-05-13 NOTE — Telephone Encounter (Signed)
I spoke to the daughter.  She informed me that all symptoms have stopped.  She believes the patient was overwhelmed with trying to get his house ready to sell.  She would like to hold off of the neuro consult until the MRI is done at [facility] this weekend.  Stated the patient will want to hold off as well.  She would like to restart the Lactulose for the ammonia levels.  Please advise directions and dosage.  Thanks!

## 2013-05-13 NOTE — Telephone Encounter (Signed)
Called and spoke with pt's daughter she is aware that the message has been sent to Dr. Fabian Sharp to verify dosing and strength.

## 2013-05-13 NOTE — Telephone Encounter (Signed)
See other note

## 2013-05-14 MED ORDER — LACTULOSE ENCEPHALOPATHY 10 GM/15ML PO SOLN: ORAL | Status: DC

## 2013-05-14 NOTE — Telephone Encounter (Signed)
Spoke with daughter Okey Regal humble [phone] States that her father seems fine does have an MRI scheduled to for early next week.  She states that the majority of the symptoms of concerns were noted by her sister and the others are very minor. He seems fine at this time. Asks if he should take the lactulose like he did in the past.  Reviewed blood work with her discussed her lack to close can be given to lower pneumonia liver but in a not be related to his history of symptoms.  Really advise a neurology consult as to opinion on the cause of the symptoms that initiated the phone call. And evaluation.  Differential diagnosis and neurology consult would help [place] get an opinion as to the is likely cause of the symptoms and thus future management decisions.   Possibly he could be having a very mild hepatic encephalopathy possibly even a TIA or nothing significant.  Will get a consult will keep the MRI appointment will send in lactulose although I am on the fence about whether this will be helpful for him at this time since he is feeling fine.   Now . Contact us if hasn't heard from [place] about appointment.

## 2013-05-16 ENCOUNTER — Other Ambulatory Visit: Payer: Medicare Other

## 2013-05-17 ENCOUNTER — Ambulatory Visit
Admission: RE | Admit: 2013-05-17 | Discharge: 2013-05-17 | Disposition: A | Discharge status: Home / Self Care | Payer: Medicare Other | Source: Ambulatory Visit | Attending: Family Medicine | Admitting: Family Medicine

## 2013-05-17 DIAGNOSIS — F05 Delirium due to known physiological condition: Secondary | ICD-10-CM

## 2013-05-17 MED ORDER — GADOBENATE DIMEGLUMINE 529 MG/ML IV SOLN
20.0000 mL | Freq: Once | INTRAVENOUS | Status: AC | PRN
  Administered 2013-05-17: 20 mL via INTRAVENOUS

## 2013-05-18 ENCOUNTER — Other Ambulatory Visit: Payer: Medicare Other

## 2013-05-18 NOTE — Progress Notes (Signed)
Quick Note:  I spoke with pt's daughter. ______

## 2013-05-21 NOTE — Telephone Encounter (Signed)
See other notes

## 2013-05-26 ENCOUNTER — Ambulatory Visit (INDEPENDENT_AMBULATORY_CARE_PROVIDER_SITE_OTHER): Payer: Medicare Other | Admitting: Neurology

## 2013-05-26 ENCOUNTER — Encounter: Payer: Self-pay | Admitting: Neurology

## 2013-05-26 VITALS — BP 132/70 | HR 88 | Temp 98.1°F | Ht 72.0 in | Wt 219.0 lb

## 2013-05-26 DIAGNOSIS — K7460 Unspecified cirrhosis of liver: Secondary | ICD-10-CM

## 2013-05-26 DIAGNOSIS — E889 Metabolic disorder, unspecified: Secondary | ICD-10-CM

## 2013-05-26 DIAGNOSIS — F29 Unspecified psychosis not due to a substance or known physiological condition: Secondary | ICD-10-CM

## 2013-05-26 DIAGNOSIS — F4489 Other dissociative and conversion disorders: Secondary | ICD-10-CM

## 2013-05-26 DIAGNOSIS — F05 Delirium due to known physiological condition: Secondary | ICD-10-CM

## 2013-05-26 LAB — VITAMIN B12: Vitamin B-12: 668 pg/mL (ref 211–911)

## 2013-05-26 NOTE — Patient Instructions (Signed)
Since symptoms have resolved since taking the lactulose, it was likely due to the elevated ammonia.  I don't suspect seizure or stroke.  However, you do have stroke risk factors so secondary stroke prevention should be optimized. 1.  We will check a B12 level.  Low B12 levels may contribute to cognitive deficits. 2.  Recommend optimizing diabetes control, blood pressure control, improved diet (Mediterranean diet) and increased exercise. 3.  No follow up needed unless there is any problems or concerns.

## 2013-05-26 NOTE — Progress Notes (Addendum)
NEUROLOGY CONSULTATION NOTE  Ronald Jensen MRN: [id number] DOB: 1940-12-10  Referring provider: Dr. Fabian Sharp Primary care provider: Dr. Fabian Sharp  Reason for consult:  Transient confusion, abnormal MRI.  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  His daughter is Dietitian.  Records and images were personally reviewed where available.    Symptoms started about two months ago.  He had begun acting differently, as noted by several family members.  He began misplacing his keys, which is something he never does.  He has always had good memory.  During conversation, he would sometimes stop midway and lose his train of thought.  One time, he was going to meet his daughter at [employer].  He drove up to the general area where the restaurant is located, but didn't know where it was.  He has never been to the [employer] before, however.  He maintains that he was oriented but just didn't know exactly where the restaurant was located.  One time, his daughter noted that he seemed to be walking off-balance.  When she brought this to his attention, he began walking straight.  He also had tremors in his hands.  There were no periods of delusions, agitation, hallucinations or confusion.  Ammonia level was 90.  MRI of brain revealed chronic white matter disease, but no acute infarct or bleed.  He was started on lactulose about 3 weeks ago.  He is now back at baseline.  Hand tremors have improved as well.  This has never happened before.  He does not have family history of dementia.  05/07/13:  Na 135, K 4.1, glucose 128, BUN 18, Cr 1.3, Total bili 2.2, direct bili 0.6, AP 141, AST 35, ALT 34, ammonia 90, TSH 3.30, WBC 4.8, Hgb 11.8, HCT 33.8, PLT 73  05/18/13 MRI Brain w/wo:  Advanced white  matter hyperintensities, primarily in bifrontal distribution, likely chronic small vessel ischemic changes.  No abnormal enhancement or acute stroke.  08/18/12:  LDL 102, TSH 2.84, Hgb A1c 7.4.  PAST MEDICAL HISTORY: Past Medical History  Diagnosis Date  . Migraine   . Hyperlipidemia   . History of chickenpox   . Cirrhosis of liver not due to alcohol     sees Dr. Rudean Hitt at [facility]     PAST SURGICAL HISTORY: Past Surgical History  Procedure Laterality Date  . Appendectomy      MEDICATIONS: Current Outpatient Prescriptions on File Prior to Visit  Medication Sig Dispense Refill  . furosemide (LASIX) 40 MG tablet TAKE 1 TABLET BY MOUTH DAILY  30 tablet  5  . lactulose, encephalopathy, (GENERLAC) 10 GM/15ML SOLN Oral: 20-30 g (30-45 mL) 1- 3- times/day; adjust dose every 1-2 days to produce 2-3 soft stools/day  900 mL  0  . lisinopril (PRINIVIL,ZESTRIL) 40 MG tablet Take 1 tablet (40 mg total) by mouth daily.  90 tablet  3   No current facility-administered medications on file prior to visit.    ALLERGIES: No Known Allergies  FAMILY HISTORY: Family History  Problem Relation Age of Onset  . Hearing loss      2 brothers   . Stroke      3 brothers ages 29 and another in his 45s.  . Alcohol abuse Father     Died age 85 from complications of respiratory exposure.  . Diabetes  Mother   . Diabetes Brother   . Osteoarthritis Mother     SOCIAL HISTORY: History   Social History  . Marital Status: Married    Spouse Name: N/A    Number of Children: N/A  . Years of Education: N/A   Occupational History  . Not on file.   Social History Main Topics  . Smoking status: Never Smoker   . Smokeless tobacco: Never Used  . Alcohol Use: No  . Drug Use: No  . Sexual Activity: Not on file   Other Topics Concern  . 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   Neg tad remote tobacco  Age  79- 62    HH o f 3  No pets  Daughter with downs died last year 01-03-2012    6 hours sleep       Neg ets fa  etoh     Upper dentures     REVIEW OF SYSTEMS: Constitutional: No fevers, chills, or sweats, no generalized fatigue, change in appetite Eyes: No visual changes, double vision, eye pain Ear, nose and throat: No hearing loss, ear pain, nasal congestion, sore throat Cardiovascular: No chest pain, palpitations Respiratory:  No shortness of breath at rest or with exertion, wheezes GastrointestinaI: No nausea, vomiting, diarrhea, abdominal pain, fecal incontinence Genitourinary:  No dysuria, urinary retention or frequency Musculoskeletal:  No neck pain, back pain Integumentary: No rash, pruritus, skin lesions Neurological: as above Psychiatric: No depression, insomnia, anxiety Endocrine: No palpitations, fatigue, diaphoresis, mood swings, change in appetite, change in weight, increased thirst Hematologic/Lymphatic:  No anemia, purpura, petechiae. Allergic/Immunologic: no itchy/runny eyes, nasal congestion, recent allergic reactions, rashes  PHYSICAL EXAM: Filed Vitals:   05/26/13 0850  BP: 132/70  Pulse: 88  Temp: 98.1 F (36.7 C)   General: No acute distress Head:  Normocephalic/atraumatic Neck: supple, no paraspinal tenderness, full range of motion Back: No paraspinal tenderness Heart: regular rate and rhythm Lungs: Clear to auscultation bilaterally. Vascular: No carotid bruits. Neurological Exam: Mental status: alert and oriented to person, place, and time, knows name of the president, speech fluent and not dysarthric, language intact. Cranial nerves: CN I: not tested CN II: pupils equal, round and reactive to light, visual fields intact, fundi unremarkable. CN III, IV, VI:  full range of motion, no nystagmus, no ptosis CN V: facial sensation intact CN VII: upper and lower face symmetric CN VIII: hearing intact CN IX, X: gag intact, uvula midline CN XI:  sternocleidomastoid and trapezius muscles intact CN XII: tongue midline Bulk & Tone: normal, no fasciculations. Motor: 5/5 throughout Sensation: mildly reduced vibration sensation in toes.  Temperature sensation intact Deep Tendon Reflexes: 1+ throughout, except absent in ankles, toes down Finger to nose testing: mild intention tremor bilaterally, no asterixis or dysmetria Heel to shin: no dysmetria Gait: normal stride, no ataxia, able to walk in tandem. Romberg negative.  IMPRESSION: Transient cognitive deficits.  Not really delirium.  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  Do not suspect seizures.  Stroke possible, but MRI did not reveal acute infarct and symptoms are non-localizable.  He does have chronic microvascular ischemic changes on MRI with stroke risk factors.  PLAN: 1.  Typically, anti-platelet therapy (such as ASA 81mg  daily) would be recommended, but this may not be possible in setting of thrombocytopenia.  In the meantime, I would optimize other secondary stroke prevention management, optimize blood pressure and diabetes control.  Improve diet (Mediterranean diet).  Increase exercise.  May consider carotid doppler, but patient does not want to pursue this at this time, particularly because he would not be interested in surgery such as CEA anyway. 2. Check B12 level. 3. No follow up unless necessary.  45 minutes spent with patient and daughter, over 50% spent counseling and coordinating care.  Thank you for allowing me to take part in the care of this patient.  Shon Millet, DO  CC:  Berniece Andreas, MD

## 2013-05-27 ENCOUNTER — Encounter: Payer: Self-pay | Admitting: Neurology

## 2013-05-28 LAB — METHYLMALONIC ACID, SERUM: Methylmalonic Acid, Quant: 0.3 umol/L (ref <0.40)

## 2013-06-07 ENCOUNTER — Telehealth: Payer: Self-pay | Admitting: Internal Medicine

## 2013-06-07 NOTE — Telephone Encounter (Signed)
Spoke to [place].  Instructed her to have the pt cut his dose in half.  She is uncertain as to exactly how much he has been taking.  She will call me back with exact amount.

## 2013-06-07 NOTE — Telephone Encounter (Signed)
Ronald Jensen/Daughter [phone]) [phone] called to ask if there is another medication that can be used instead of Generlac BID.  Reports has to "go" all the time and has occasional fecal incontinence. Okey Regal does not know how many stools he is having daily.  Patient not present for triage and is hard of hearing so unable to be contacted for triage.  If no other medication can be substituted, he needs refill of Generlac; has enough for today's dose.  [facility]/[address]  Please call Okey Regal back.

## 2013-06-07 NOTE — Telephone Encounter (Signed)
Usually we  Decrease the dose of medication so that there is just loose stools and no diarrhea.  Please see how much he is taking and cut the dose in half and record .  Contact us about how this is working in the next week.

## 2013-06-09 NOTE — Telephone Encounter (Signed)
The original amount is 45 ml BID.  Now he is taking 30 ml one time per day and not having any issues as before.  He is getting 2 BM's a day. Pt does need a refill b/c he is out. [facility]/ [address]

## 2013-06-11 ENCOUNTER — Other Ambulatory Visit: Payer: Self-pay | Admitting: Family Medicine

## 2013-06-11 MED ORDER — LACTULOSE ENCEPHALOPATHY 10 GM/15ML PO SOLN: ORAL | Status: DC

## 2013-06-11 NOTE — Telephone Encounter (Signed)
Sent in one refill to the pharmacy.

## 2013-06-11 NOTE — Telephone Encounter (Signed)
Agree with this plan refill to take  30 cc for now until next visit

## 2013-08-16 ENCOUNTER — Other Ambulatory Visit (INDEPENDENT_AMBULATORY_CARE_PROVIDER_SITE_OTHER): Payer: Medicare Other

## 2013-08-16 DIAGNOSIS — E785 Hyperlipidemia, unspecified: Secondary | ICD-10-CM

## 2013-08-16 DIAGNOSIS — E1165 Type 2 diabetes mellitus with hyperglycemia: Secondary | ICD-10-CM

## 2013-08-16 DIAGNOSIS — I10 Essential (primary) hypertension: Secondary | ICD-10-CM

## 2013-08-16 DIAGNOSIS — IMO0001 Reserved for inherently not codable concepts without codable children: Secondary | ICD-10-CM

## 2013-08-16 LAB — LIPID PANEL
CHOLESTEROL: 134 mg/dL (ref 0–200)
HDL: 45.5 mg/dL (ref >39.00)
LDL CALC: 70 mg/dL (ref 0–99)
TRIGLYCERIDES: 91 mg/dL (ref 0.0–149.0)
Total CHOL/HDL Ratio: 3
VLDL: 18.2 mg/dL (ref 0.0–40.0)

## 2013-08-16 LAB — CBC WITH DIFFERENTIAL/PLATELET
BASOS ABS: 0 10*3/uL (ref 0.0–0.1)
Basophils Relative: 0.4 % (ref 0.0–3.0)
EOS ABS: 0.4 10*3/uL (ref 0.0–0.7)
Eosinophils Relative: 7.7 % — ABNORMAL HIGH (ref 0.0–5.0)
HCT: 33.2 % — ABNORMAL LOW (ref 39.0–52.0)
Hemoglobin: 11.4 g/dL — ABNORMAL LOW (ref 13.0–17.0)
LYMPHS PCT: 25.8 % (ref 12.0–46.0)
Lymphs Abs: 1.5 10*3/uL (ref 0.7–4.0)
MCHC: 34.2 g/dL (ref 30.0–36.0)
MCV: 93.9 fl (ref 78.0–100.0)
Monocytes Absolute: 0.7 10*3/uL (ref 0.1–1.0)
Monocytes Relative: 11.6 % (ref 3.0–12.0)
NEUTROS PCT: 54.5 % (ref 43.0–77.0)
Neutro Abs: 3.1 10*3/uL (ref 1.4–7.7)
PLATELETS: 87 10*3/uL — AB (ref 150.0–400.0)
RBC: 3.53 Mil/uL — ABNORMAL LOW (ref 4.22–5.81)
RDW: 13.4 % (ref 11.5–14.6)
WBC: 5.8 10*3/uL (ref 4.5–10.5)

## 2013-08-16 LAB — BASIC METABOLIC PANEL
BUN: 16 mg/dL (ref 6–23)
CALCIUM: 8.5 mg/dL (ref 8.4–10.5)
CHLORIDE: 106 meq/L (ref 96–112)
CO2: 24 mEq/L (ref 19–32)
CREATININE: 1.3 mg/dL (ref 0.4–1.5)
GFR: 57.48 mL/min — ABNORMAL LOW (ref >60.00)
Glucose, Bld: 99 mg/dL (ref 70–99)
Potassium: 3.9 mEq/L (ref 3.5–5.1)
Sodium: 136 mEq/L (ref 135–145)

## 2013-08-16 LAB — HEPATIC FUNCTION PANEL
ALT: 27 U/L (ref 0–53)
AST: 32 U/L (ref 0–37)
Albumin: 2.7 g/dL — ABNORMAL LOW (ref 3.5–5.2)
Alkaline Phosphatase: 136 U/L — ABNORMAL HIGH (ref 39–117)
BILIRUBIN DIRECT: 0.7 mg/dL — AB (ref 0.0–0.3)
BILIRUBIN TOTAL: 2.4 mg/dL — AB (ref 0.3–1.2)
Total Protein: 6.5 g/dL (ref 6.0–8.3)

## 2013-08-16 LAB — HEMOGLOBIN A1C: Hgb A1c MFr Bld: 6 % (ref 4.6–6.5)

## 2013-08-20 ENCOUNTER — Ambulatory Visit (INDEPENDENT_AMBULATORY_CARE_PROVIDER_SITE_OTHER): Payer: Medicare Other | Admitting: Internal Medicine

## 2013-08-20 ENCOUNTER — Encounter: Payer: Self-pay | Admitting: Internal Medicine

## 2013-08-20 VITALS — BP 144/60 | Temp 98.0°F | Wt 219.0 lb

## 2013-08-20 DIAGNOSIS — E119 Type 2 diabetes mellitus without complications: Secondary | ICD-10-CM | POA: Insufficient documentation

## 2013-08-20 DIAGNOSIS — K7460 Unspecified cirrhosis of liver: Secondary | ICD-10-CM

## 2013-08-20 DIAGNOSIS — N5089 Other specified disorders of the male genital organs: Secondary | ICD-10-CM | POA: Insufficient documentation

## 2013-08-20 DIAGNOSIS — I10 Essential (primary) hypertension: Secondary | ICD-10-CM

## 2013-08-20 DIAGNOSIS — N508 Other specified disorders of male genital organs: Secondary | ICD-10-CM

## 2013-08-20 DIAGNOSIS — K469 Unspecified abdominal hernia without obstruction or gangrene: Secondary | ICD-10-CM

## 2013-08-20 DIAGNOSIS — R21 Rash and other nonspecific skin eruption: Secondary | ICD-10-CM

## 2013-08-20 MED ORDER — DESONIDE 0.05 % EX CREA: TOPICAL_CREAM | Freq: Two times a day (BID) | CUTANEOUS | Status: AC

## 2013-08-20 NOTE — Patient Instructions (Signed)
urologic referral in regard to the swollen testicle You have umbilical hernia noted on the previous CT scan we can do a surgery referral but would [facility]. consider getting abdominal ultrasound in the meantime Blood sugar is better. Can decrease the lactulose to 15 cc per day to avoid diarrhea Skin rash could be eczema or other.   We'll send in a cortisone cream to try twice a day in addition to moisturizers that you can use. Followup if not improving.  will send copy of labs to [facility]  Specialist .   return office visit in 3 months  Or as needed

## 2013-08-20 NOTE — Progress Notes (Signed)
Chief Complaint  Patient presents with  . Follow-up    Labs    HPI: Comes in for follow up of  multiple medical issues DM no change  BP taking med no change CNS had consult  No recurrance felt to be  From liver disease and no more sx but having frequent loose bowels that   Ar problematic on  =onm 15- 30   New problem left test painless swelling for months  No hx of same bothers some with coughing   Hx of swelling ant umbi non tender ? If hernia no change   uncertain if abd swelling    Rash dry skin over weeks on back felt secondary to change in laundry soaps ? Dry skin itching for few weeks( not all over)   ROS: See pertinent positives and negatives per HPI. Denies cp sob. No bleeding. Has appt  [facility] in [place].  Neuro assessment in fall  Since symptoms have resolved since taking the lactulose, it was likely due to the elevated ammonia. I don't suspect seizure or stroke. However, you do have stroke risk factors so secondary stroke prevention should be optimized.  1. We will check a B12 level. Low B12 levels may contribute to cognitive deficits.  2. Recommend optimizing diabetes control, blood pressure control, improved diet (Mediterranean diet) and increased exercise.  3. No follow up needed unless there is any problems or concerns. b12 and mm a werer normal    Past Medical History  Diagnosis Date  . Migraine   . Hyperlipidemia   . History of chickenpox   . Cirrhosis of liver not due to alcohol     sees Dr. Rudean Hitt at [facility] History  Problem Relation Age of Onset  . Hearing loss      2 brothers   . Stroke      3 brothers ages 70 and another in his 32s.  . Alcohol abuse Father     Died age 76 from complications of respiratory exposure.  . Diabetes Mother   . Diabetes Brother   . Osteoarthritis Mother     History   Social History  . Marital Status: Married    Spouse Name: N/A    Number of Children: N/A  . Years of Education: N/A   Social  History Main Topics  . Smoking status: Never Smoker   . Smokeless tobacco: Never Used  . Alcohol Use: No  . Drug Use: No  . Sexual Activity: None   Other Topics Concern  . 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 desonide (DESOWEN) 0.05 % cream Apply topically 2 (two) times daily.  . furosemide (LASIX) 40 MG tablet TAKE 1 TABLET BY MOUTH DAILY  . lactulose, encephalopathy, (GENERLAC) 10 GM/15ML SOLN Oral: 20-30 g (30-45 mL) 1- 3- times/day; adjust dose every 1-2 days to produce 2-3 soft stools/day  . lisinopril (PRINIVIL,ZESTRIL) 40 MG tablet Take 1 tablet (40 mg total) by mouth daily.  EXAM:  BP 144/60  Temp(Src) 98 F (36.7 C) (Oral)  Wt 219 lb (99.338 kg)  Body mass index is 29.7 kg/(m^2).  GENERAL: vitals reviewed and listed above, alert, oriented, appears well hydrated and in no acute distress looks Wt Readings from Last 3 Encounters:  08/20/13 219 lb (99.338 kg)  05/26/13 219 lb (99.338 kg)  05/07/13 216 lb (97.977 kg)    HEENT: atraumatic, conjunctiva  clear, no obvious abnormalities on inspection of external nose and ears  Minimal icterus    NECK: no obvious masses on inspection palpation  LUNGS: clear to auscultation bilaterally, no wheezes, rales or rhonchi, good air movement CV: HRRR,rate 90 no clubbing cyanosis or 1+ edema nl cap refill  Abd: Protruded with periumbilical firm area about 3-4 cm nontender possible hernia ballotable MS: moves all extremities without noticeable focal  Abnormality Skin no bruising or bleeding large passages of brown scaly thickened areas mid back lower. A few red bumps on the upper back External GU left testicle  scrotum very large compared to right nontender no redness skin changes PSYCH: pleasant and cooperative, no obvious depression or anxiety daughter has signed to help communicate today. Out of the room for part of the exam. Lab Results  Component Value Date   WBC 5.8 08/16/2013   HGB 11.4* 08/16/2013   HCT 33.2* 08/16/2013   PLT 87.0* 08/16/2013   GLUCOSE 99 08/16/2013   CHOL 134 08/16/2013   TRIG 91.0 08/16/2013   HDL 45.50 08/16/2013   LDLCALC 70 08/16/2013   ALT 27 08/16/2013   AST 32 08/16/2013   NA 136 08/16/2013   K 3.9 08/16/2013   CL 106 08/16/2013   CREATININE 1.3 08/16/2013   BUN 16 08/16/2013   CO2 24 08/16/2013   TSH 3.30 05/07/2013   PSA 0.62 03/29/2008   INR 1.27 11/01/2011   HGBA1C 6.0 08/16/2013   reveiwed other care from [facility] in fall 14  ASSESSMENT AND PLAN:  Discussed the following assessment and plan:  Diabetes type 2, controlled - better  Enlarged testicle - left mostly painless  scrotal swelling. 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 to 15 qd   Abdominal hernia - hx of ct  ? periumbilical lump is th cause of this .   Rash and nonspecific skin eruption - dry patches on back rx with moisturizer and steroid topical  no obv fungal cause fu if progressive.  Suggest fu with [facility] before may about th abd  Hernia area concers  reviewed every where  last scan.  -Patient advised to return or notify health care team  if symptoms worsen or persist or new concerns arise.  Patient Instructions  urologic referral in regard to the swollen testicle You have umbilical hernia noted on the previous CT scan we can do a surgery referral but [facility]. consider getting abdominal ultrasound in the meantime Blood sugar is better. Can decrease the lactulose to 15 cc per day to avoid  diarrhea Skin rash could be eczema or other.   We'll send in a cortisone cream to try twice a day in addition to moisturizers that you can use. Followup if not improving.  will send copy of labs to [facility]  Specialist .   return office visit in 3 months  Or as needed  Neta Mends. Panosh M.D.  Pre visit review using our clinic review tool, if applicable. No additional management support is needed unless otherwise documented below in the visit note. Total visit > 50% spent counseling and coordinating care

## 2013-08-26 ENCOUNTER — Telehealth: Payer: Self-pay

## 2013-08-26 NOTE — Telephone Encounter (Signed)
Relevant patient education mailed to patient.  

## 2013-10-20 ENCOUNTER — Encounter: Payer: Self-pay | Admitting: Internal Medicine

## 2013-10-20 NOTE — Progress Notes (Signed)
[facility]. Patient was supposed to get inguinal and abdominal hernia repair today. However he was noted to have ascites a dry cough and some confusion. His ammonia level was 80 Was told he stopped taking his lactulose because of the diarrhea side effect. He was somewhat tachycardic x-Sayvion showed mild pulmonary edema versus fibrosis. He had no lower extremity edema. They're canceling the surgery.  Advised have them get him an appointment with hepatology GI as they have been doing the majority of his cirrhotic evaluation and care.  And also have to make appointment with us and have him get on his lactulose.  They will send us notes labs etc.

## 2013-10-22 ENCOUNTER — Telehealth: Payer: Self-pay | Admitting: Family Medicine

## 2013-10-22 ENCOUNTER — Other Ambulatory Visit: Payer: Self-pay | Admitting: Family Medicine

## 2013-10-22 MED ORDER — LACTULOSE ENCEPHALOPATHY 10 GM/15ML PO SOLN: ORAL | Status: DC

## 2013-10-22 NOTE — Telephone Encounter (Signed)
Spoke to Okey RegalCarol (daughter) and instructed her to have the pt stop the lisinopril.  She has made a 30 minute appt next week and has an upcoming appt with the liver specialist next week.

## 2013-10-22 NOTE — Telephone Encounter (Signed)
Message copied by Nils FlackADKINS, MISTY T on Fri Oct 22, 2013  8:07 AM ------      Message from: [facility], [place] K      Created: Fri Oct 22, 2013  6:04 AM      Regarding: follow up       Misty ;      Please contact patient?daughter       Have him stop the ace inhibitor lisinopril .      Plan 30 minutes OV next week      Also [facility]       Thanks       [facility] ------

## 2013-10-25 ENCOUNTER — Encounter: Payer: Self-pay | Admitting: Internal Medicine

## 2013-10-25 ENCOUNTER — Ambulatory Visit (INDEPENDENT_AMBULATORY_CARE_PROVIDER_SITE_OTHER): Payer: Medicare Other | Admitting: Internal Medicine

## 2013-10-25 VITALS — BP 156/70 | HR 116 | Temp 98.6°F | Ht 71.75 in | Wt 222.0 lb

## 2013-10-25 DIAGNOSIS — I10 Essential (primary) hypertension: Secondary | ICD-10-CM

## 2013-10-25 DIAGNOSIS — R9389 Abnormal findings on diagnostic imaging of other specified body structures: Secondary | ICD-10-CM | POA: Insufficient documentation

## 2013-10-25 DIAGNOSIS — K7460 Unspecified cirrhosis of liver: Secondary | ICD-10-CM

## 2013-10-25 DIAGNOSIS — R05 Cough: Secondary | ICD-10-CM | POA: Insufficient documentation

## 2013-10-25 DIAGNOSIS — D696 Thrombocytopenia, unspecified: Secondary | ICD-10-CM

## 2013-10-25 DIAGNOSIS — R918 Other nonspecific abnormal finding of lung field: Secondary | ICD-10-CM

## 2013-10-25 DIAGNOSIS — D649 Anemia, unspecified: Secondary | ICD-10-CM | POA: Insufficient documentation

## 2013-10-25 DIAGNOSIS — K766 Portal hypertension: Secondary | ICD-10-CM

## 2013-10-25 DIAGNOSIS — R059 Cough, unspecified: Secondary | ICD-10-CM | POA: Insufficient documentation

## 2013-10-25 DIAGNOSIS — R188 Other ascites: Principal | ICD-10-CM | POA: Insufficient documentation

## 2013-10-25 DIAGNOSIS — R609 Edema, unspecified: Secondary | ICD-10-CM

## 2013-10-25 MED ORDER — SPIRONOLACTONE 50 MG PO TABS: 50.0000 mg | ORAL_TABLET | Freq: Every day | ORAL | Status: DC

## 2013-10-25 NOTE — Progress Notes (Signed)
Chief Complaint  Patient presents with  . Follow-up    HPI: Here with daughter today. Told to come in because has developed ascites that had inguinal unbi hernia surggery canceled . Ammonia level reported inc and more confusion that before . See note of phone call from surgery department.  Had not been taking the lasix and the lactulose . Told to stop the acei and fu Has appt with gi liver at [facility] this week .  Breathing sob ifwalking fast hard to sleep at night flat with breathing . Still has cough.  Hard to sleep with cough at night.   Standing.   No bruising bleeding   Cns better after starting lactulose again according to his daughter  Few loose stools per day   ROS: See pertinent positives and negatives per HPI  No cp  No syncope   Past Medical History  Diagnosis Date  . Migraine   . Hyperlipidemia   . History of chickenpox   . Cirrhosis of liver not due to alcohol     [facility]     Family History  Problem Relation Age of Onset  . Hearing loss      2 brothers   . Stroke      3 brothers ages 15866 and another in his 3960s.  . Alcohol abuse Father     Died age 71 from complications of respiratory exposure.  . Diabetes Mother   . Diabetes Brother   . Osteoarthritis Mother     History   Social History  . Marital Status: Married    Spouse Name: N/A    Number of Children: N/A  . Years of Education: N/A   Social History Main Topics  . Smoking status: Never Smoker   . Smokeless tobacco: Never Used  . Alcohol Use: No  . Drug Use: No  . Sexual Activity: None   Other Topics Concern  . 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 desonide (DESOWEN) 0.05 % cream Apply topically 2 (two) times daily.  . furosemide (LASIX) 40 MG tablet TAKE 1 TABLET BY MOUTH DAILY  . lactulose, encephalopathy, (GENERLAC) 10 GM/15ML SOLN Oral: 20-30 g (30-45 mL) 1- 3- times/day; adjust dose every 1-2 days to produce 2-3 soft stools/day  . spironolactone (ALDACTONE) 50 MG tablet Take 1-2 tablets (50-100 mg total) by mouth daily.  . [DISCONTINUED] lisinopril (PRINIVIL,ZESTRIL) 40 MG tablet Take 1 tablet (40 mg total) by mouth daily.    EXAM:  BP 156/70  Pulse 116  Temp(Src) 98.6 F (37 C) (Oral)  Ht 5' 11.75" (1.822 m)  Wt 222 lb (100.699 kg)  BMI 30.33 kg/m2  SpO2 96%  Body mass index is 30.33 kg/(m^2).  GENERAL: vitals reviewed and listed above, alert, oriented, appears well hydrated and in no acute distress  Has protuberant abd and more wasted appearing ext and face  HEENT: atraumatic, conjunctiva  clear, no obvious abnormalities on inspection of external nose and ears  NECK: no obvious masses on inspection palpation  Neck ? jvd 1-2 cm sitting 45 deg  LUNGS:  Few basilar crackles  Dec bs right base? No rales rhonchi CV: HRRR, no clubbing cyanosis no g or m rate 110  nl cap refill LE 1-2+ edema no redness  abd large protuberance  Non tender with umbi hernia  MS: moves all extremities without noticeable focal  abnormality PSYCH: pleasant and cooperative, no obvious depression or anxiety No asterixis follow sdirection Labs reviewed cr 1.34 pot 4.2 alb 2.7 INR1.27ammonia 80 ekg sinu tack  cbc 5.5 wbc 10.9 hg plt 91k.  c xray diffuse interstitial prom pulm edema vs fibrosis  Wt Readings from Last 3 Encounters:  10/25/13 222 lb (100.699 kg)  08/20/13 219 lb (99.338 kg)  05/26/13 219 lb (99.338 kg)    ASSESSMENT AND PLAN:  Discussed the following assessment and plan:  Cirrhosis of liver with ascites - failry recent onsetof ascites ; advise conplinace with lasix and add spironolactone  and close fu of bmp here or at  [facility]    Cough  Abnormal chest x-Quanah - interstitial markings poss pulm edema  Portal hypertension  Edema - plan daily weights to help with fluid managment  Hypertension  Anemia, unspecified  Thrombocytopenia Daughter to get [place] fmla form  -Patient advised to return or notify health care team  if symptoms worsen ,persist or new concerns arise.  Patient Instructions   Take the lasix 40 mg every day Add spironolactone  Medication also.  Discontinue the lisinopril as we have done .   Plan check   Echo test of heart   You may need to  Have fluid drawn off of the abdomen to evaluate the fluid and help decrease the swelling .   I will leave that up to the liver specialists.  Continue  on the lactulose to help with the ammonia and mental clarity if Gi advises other intervention.   Begin doing daily weights to help decide on how the  Diuresis is doing .   Recheck chemistry panel   BMP in 7-10 days after being on the diuretics ( no ov needed at that time)   return office visit in 2 -4 weeks or as needed.   We will ocntact you if change in plans .   Neta Mends. Panosh M.D.  Pre visit review using our clinic review tool, if applicable. No additional management support is needed unless otherwise documented below in the visit note. Total visit > 50% spent counseling and coordinating care

## 2013-10-25 NOTE — Patient Instructions (Addendum)
   Take the lasix 40 mg every day Add spironolactone  Medication also.  Discontinue the lisinopril as we have done .   Plan check   Echo test of heart   You may need to  Have fluid drawn off of the abdomen to evaluate the fluid and help decrease the swelling .   I will leave that up to the liver specialists.  Continue  on the lactulose to help with the ammonia and mental clarity if Gi advises other intervention.   Begin doing daily weights to help decide on how the  Diuresis is doing .   Recheck chemistry panel   BMP in 7-10 days after being on the diuretics ( no ov needed at that time)   return office visit in 2 -4 weeks or as needed.   We will ocntact you if change in plans .

## 2013-11-03 ENCOUNTER — Telehealth: Payer: Self-pay

## 2013-11-03 NOTE — Telephone Encounter (Signed)
[name] called and said Dr. Fabian SharpPanosh was going to order labs prior to his visit on 11/26/13. No lab orders showing

## 2013-11-04 ENCOUNTER — Other Ambulatory Visit: Payer: Self-pay | Admitting: Family Medicine

## 2013-11-04 DIAGNOSIS — R609 Edema, unspecified: Secondary | ICD-10-CM

## 2013-11-04 DIAGNOSIS — I10 Essential (primary) hypertension: Secondary | ICD-10-CM

## 2013-11-04 NOTE — Telephone Encounter (Signed)
Order placed in the system. 

## 2013-11-05 ENCOUNTER — Telehealth: Payer: Self-pay

## 2013-11-05 ENCOUNTER — Other Ambulatory Visit (INDEPENDENT_AMBULATORY_CARE_PROVIDER_SITE_OTHER): Payer: Medicare Other

## 2013-11-05 DIAGNOSIS — E119 Type 2 diabetes mellitus without complications: Secondary | ICD-10-CM

## 2013-11-05 DIAGNOSIS — R410 Disorientation, unspecified: Secondary | ICD-10-CM

## 2013-11-05 DIAGNOSIS — K7460 Unspecified cirrhosis of liver: Secondary | ICD-10-CM

## 2013-11-05 DIAGNOSIS — F05 Delirium due to known physiological condition: Principal | ICD-10-CM

## 2013-11-05 DIAGNOSIS — R609 Edema, unspecified: Secondary | ICD-10-CM

## 2013-11-05 DIAGNOSIS — I10 Essential (primary) hypertension: Secondary | ICD-10-CM

## 2013-11-05 LAB — BASIC METABOLIC PANEL
BUN: 13 mg/dL (ref 6–23)
CHLORIDE: 101 meq/L (ref 96–112)
CO2: 22 meq/L (ref 19–32)
Calcium: 8.7 mg/dL (ref 8.4–10.5)
Creatinine, Ser: 1.4 mg/dL (ref 0.4–1.5)
GFR: 55.01 mL/min — AB (ref >60.00)
GLUCOSE: 205 mg/dL — AB (ref 70–99)
POTASSIUM: 3.9 meq/L (ref 3.5–5.1)
SODIUM: 131 meq/L — AB (ref 135–145)

## 2013-11-05 NOTE — Telephone Encounter (Signed)
Did I receive paper work?   i didn't see any in my box  Ok to do ammonia level with next labs  What is the status iof his visit s

## 2013-11-05 NOTE — Telephone Encounter (Signed)
Pt daughter would like to know when Fmla paperwork would be ready.. She also would like to know when he has his lab drawn if they can test his ammonia  Level //

## 2013-11-08 NOTE — Telephone Encounter (Signed)
Paperwork on your desk.

## 2013-11-09 NOTE — Telephone Encounter (Signed)
Daughter Ronald Jensen , fu on paperwork. FMLA paperwork is due tomorrow. 4/1. pls fax asap/ Also would like to speak w/ you about labs. Was ammonia level done? pls advise and call Okey Regalarol

## 2013-11-09 NOTE — Telephone Encounter (Signed)
I spoke to [place].  She states that Ronald Jensen's breathing is much better.  He is no longer coughing as much.  His weight has gone down. She would like like to repeat lab work before he comes in for OV on 11/26/13.  Please advise what you would like to order. Will make sure ammonia will be ordered. Thanks!

## 2013-11-10 ENCOUNTER — Other Ambulatory Visit: Payer: Self-pay | Admitting: Internal Medicine

## 2013-11-10 NOTE — Telephone Encounter (Signed)
BMP, ammonia level, cbcdiff and hga1c

## 2013-11-11 ENCOUNTER — Telehealth: Payer: Self-pay | Admitting: Internal Medicine

## 2013-11-11 ENCOUNTER — Telehealth: Payer: Self-pay

## 2013-11-11 MED ORDER — FUROSEMIDE 40 MG PO TABS: ORAL_TABLET | ORAL | Status: DC

## 2013-11-11 NOTE — Telephone Encounter (Signed)
Left a message at the below listed number to inform Okey RegalCarol that I have resent the rx.

## 2013-11-11 NOTE — Telephone Encounter (Signed)
Sent to the pharmacy.  Carol notified.

## 2013-11-11 NOTE — Telephone Encounter (Signed)
[name] called back to say that she contacted [facility] on [address] and they told her that they had not received rx for pt   furosemide (LASIX) 40 MG tablet your note is saying that the rx has been sent to the office . She said her dad only has enough to last him till tomorrow 11/12/12

## 2013-11-11 NOTE — Telephone Encounter (Signed)
Resent to the pharmacy

## 2013-11-11 NOTE — Telephone Encounter (Signed)
Orders placed in the system.  Carol notified.  Lab appt made.

## 2013-11-11 NOTE — Telephone Encounter (Signed)
Pt needs refill on furosemide 40 mg #30 w/refills sent to [facility] on [address]. Pt is out

## 2013-11-19 ENCOUNTER — Telehealth: Payer: Self-pay | Admitting: Family Medicine

## 2013-11-19 ENCOUNTER — Other Ambulatory Visit: Payer: Self-pay | Admitting: Family Medicine

## 2013-11-19 ENCOUNTER — Other Ambulatory Visit (INDEPENDENT_AMBULATORY_CARE_PROVIDER_SITE_OTHER): Payer: Medicare Other

## 2013-11-19 DIAGNOSIS — R410 Disorientation, unspecified: Secondary | ICD-10-CM

## 2013-11-19 DIAGNOSIS — I10 Essential (primary) hypertension: Secondary | ICD-10-CM

## 2013-11-19 DIAGNOSIS — R609 Edema, unspecified: Secondary | ICD-10-CM

## 2013-11-19 DIAGNOSIS — E119 Type 2 diabetes mellitus without complications: Secondary | ICD-10-CM

## 2013-11-19 DIAGNOSIS — K7460 Unspecified cirrhosis of liver: Secondary | ICD-10-CM

## 2013-11-19 DIAGNOSIS — F05 Delirium due to known physiological condition: Secondary | ICD-10-CM

## 2013-11-19 LAB — BASIC METABOLIC PANEL
BUN: 15 mg/dL (ref 6–23)
CHLORIDE: 101 meq/L (ref 96–112)
CO2: 24 mEq/L (ref 19–32)
Calcium: 8.6 mg/dL (ref 8.4–10.5)
Creatinine, Ser: 1.2 mg/dL (ref 0.4–1.5)
GFR: 66.09 mL/min (ref >60.00)
Glucose, Bld: 119 mg/dL — ABNORMAL HIGH (ref 70–99)
Potassium: 3.8 mEq/L (ref 3.5–5.1)
Sodium: 132 mEq/L — ABNORMAL LOW (ref 135–145)

## 2013-11-19 LAB — CBC WITH DIFFERENTIAL/PLATELET
Basophils Absolute: 0 10*3/uL (ref 0.0–0.1)
Basophils Relative: 0.6 % (ref 0.0–3.0)
EOS PCT: 0 % (ref 0.0–5.0)
Eosinophils Absolute: 0 10*3/uL (ref 0.0–0.7)
HCT: 32.8 % — ABNORMAL LOW (ref 39.0–52.0)
Hemoglobin: 11.3 g/dL — ABNORMAL LOW (ref 13.0–17.0)
Lymphocytes Relative: 17.3 % (ref 12.0–46.0)
Lymphs Abs: 0.9 10*3/uL (ref 0.7–4.0)
MCHC: 34.4 g/dL (ref 30.0–36.0)
MCV: 92.4 fl (ref 78.0–100.0)
Monocytes Absolute: 0.8 10*3/uL (ref 0.1–1.0)
Monocytes Relative: 15.5 % — ABNORMAL HIGH (ref 3.0–12.0)
Neutro Abs: 3.6 10*3/uL (ref 1.4–7.7)
Neutrophils Relative %: 66.6 % (ref 43.0–77.0)
Platelets: 89 10*3/uL — ABNORMAL LOW (ref 150.0–400.0)
RBC: 3.55 Mil/uL — ABNORMAL LOW (ref 4.22–5.81)
RDW: 15.1 % — ABNORMAL HIGH (ref 11.5–14.6)
WBC: 5.4 10*3/uL (ref 4.5–10.5)

## 2013-11-19 LAB — HEMOGLOBIN A1C: HEMOGLOBIN A1C: 5.9 % (ref 4.6–6.5)

## 2013-11-19 LAB — AMMONIA: Ammonia: 128 umol/L — ABNORMAL HIGH (ref 16–53)

## 2013-11-19 NOTE — Telephone Encounter (Signed)
Per WP, should increase lactulose to 10 ml tid to produce 2-3 bowel movements daily.  Can decrease the third dose to 5 ml if necessary.  Patient's daughter notified.  Chart updated.  Ronald DerryCarole notified that paperwork has been updated.

## 2013-11-19 NOTE — Telephone Encounter (Signed)
Revonda StandardAllison from [facility] called [facility] the [facility]'s ammonia level is 128.  WP notified.  Tried to reach the daughter to find out more information.  Left a message on Carol's cell. Will wait on a return call.

## 2013-11-19 NOTE — Telephone Encounter (Signed)
Spoke to [place] Gatearole.  She stated the pt is not acting differently.  Still forgetful.  Claims to have 2-3 bowel movements per day.  Has an appt with you next Friday.  Enid DerryCarole also needed FMLA paperwork altered.    Q5:  Needs to say that Keahi will have 1-2 appt per month lasting 4 hours (when they travel)  Q6:  Needs to know how many episodes.  Please review.  Need to have paperwork turned in by next Tuesday, 11/23/13.

## 2013-11-26 ENCOUNTER — Other Ambulatory Visit: Payer: Self-pay | Admitting: Family Medicine

## 2013-11-26 ENCOUNTER — Ambulatory Visit (INDEPENDENT_AMBULATORY_CARE_PROVIDER_SITE_OTHER): Payer: Medicare Other | Admitting: Internal Medicine

## 2013-11-26 ENCOUNTER — Encounter: Payer: Self-pay | Admitting: Internal Medicine

## 2013-11-26 VITALS — BP 134/80 | Ht 71.75 in | Wt 202.0 lb

## 2013-11-26 DIAGNOSIS — K7460 Unspecified cirrhosis of liver: Secondary | ICD-10-CM

## 2013-11-26 DIAGNOSIS — R0989 Other specified symptoms and signs involving the circulatory and respiratory systems: Secondary | ICD-10-CM

## 2013-11-26 DIAGNOSIS — E119 Type 2 diabetes mellitus without complications: Secondary | ICD-10-CM

## 2013-11-26 DIAGNOSIS — R0609 Other forms of dyspnea: Secondary | ICD-10-CM

## 2013-11-26 DIAGNOSIS — R0600 Dyspnea, unspecified: Secondary | ICD-10-CM | POA: Insufficient documentation

## 2013-11-26 DIAGNOSIS — R188 Other ascites: Principal | ICD-10-CM

## 2013-11-26 MED ORDER — FUROSEMIDE 40 MG PO TABS: ORAL_TABLET | ORAL | Status: DC

## 2013-11-26 MED ORDER — SPIRONOLACTONE 50 MG PO TABS: 150.0000 mg | ORAL_TABLET | Freq: Every day | ORAL | Status: DC

## 2013-11-26 NOTE — Progress Notes (Signed)
Chief Complaint  Patient presents with  . Follow-up    HPI: Ronald Jensen  comes in today for follow up of  multiple medical problems.  He is here with his daughter today who signs. Since his last visit he has been given spironolactone in addition to his Lasix his ACE inhibitor has been stopped. His swelling is down some but still not back to normal. No unusual bleeding. Daughter thinks his mental status is okay although occasionally uncertain diffuse forgetting his medication. When discussed his lactulose he isn't really up to 2-3 loose stools a day. They're going to have to move into their household to make sure that he can take his medicines correctly.  He has a followup with the liver specialist at [facility] in Dec 29, 2022.  ROS: See pertinent positives and negatives per HPI. No chest pain has some shortness of breath dyspnea on exertion he doesn't feel severe. Although daughter says he helps 1 puff someone going up steps. His cough is just about gone. Sometimes he sleeps more than usual daughter is unsure what the signs of deterioration encephalopathy would be.  Past Medical History  Diagnosis Date  . Migraine   . Hyperlipidemia   . History of chickenpox   . Cirrhosis of liver not due to alcohol     sees Dr. Rudean Hitt at [facility] History  Problem Relation Age of Onset  . Hearing loss      2 brothers   . Stroke      3 brothers ages 52 and another in his 58s.  . Alcohol abuse Father     Died age 636 from complications of respiratory exposure.  . Diabetes Mother   . Diabetes Brother   . Osteoarthritis Mother     History   Social History  . Marital Status: Married    Spouse Name: N/A    Number of Children: N/A  . Years of Education: N/A   Social History Main Topics  . Smoking status: Never Smoker   . Smokeless tobacco: Never Used  . Alcohol Use: No  . Drug Use: No  . Sexual Activity: None   Other Topics Concern  . None   Social History Narrative   Widowed   2 year   Became deaf at age 65   Retired from the [employer]   Neg tad remote tobacco  Age 37- 89    HH o f 3  No pets  Daughter with downs died last year 12-29-11    6 hours sleep       Neg ets fa  etoh     Upper dentures     Outpatient Encounter Prescriptions as of 11/26/2013  Medication Sig  . furosemide (LASIX) 40 MG tablet TAKE 1 TABLET BY MOUTH EVERY DAYcan increase to 1 po bid  . lactulose, encephalopathy, (CHRONULAC) 10 GM/15ML SOLN Take 10ml TID to produce 2-3 bowel movements daily.  Marland Kitchen omeprazole (PRILOSEC) 40 MG capsule   . [DISCONTINUED] furosemide (LASIX) 40 MG tablet TAKE 1 TABLET BY MOUTH EVERY DAY  . [DISCONTINUED] spironolactone (ALDACTONE) 50 MG tablet Take 1-2 tablets (50-100 mg total) by mouth daily.  . [DISCONTINUED] spironolactone (ALDACTONE) 50 MG tablet Take 3-4 tablets (150-200 mg total) by mouth daily. Take 3 per day increase to 4 per day after 3-4 days as tolerated  . desonide (DESOWEN) 0.05 % cream Apply topically 2 (two) times daily.    EXAM:  BP 134/80  Ht  5' 11.75" (1.822 m)  Wt 202 lb (91.627 kg)  BMI 27.60 kg/m2  Body mass index is 27.6 kg/(m^2).  GENERAL: vitals reviewed and listed above, alert, oriented, appears well hydrated and in no acute distress HEENT: atraumatic, conjunctiva  clear, no obvious abnormalities on inspection of external nose and ears  NECK: no obvious masses on inspection palpation and don't see JVD LUNGS: Breath sounds equal no wheezing there is some fine crackles Velcro-like at both bases dry no wheezes good air movement  CV: HRRR, I don't hear a gallop or murmur but rate 94 no clubbing cyanosis or  +1 to trace peripheral edema nl cap refill  Abdomen soft ascites decrease in size not as tense hernia is reducible at the umbilical area nontender Skin nonicteric today MS: moves all extremities without noticeable focal  abnormality PSYCH: pleasant and cooperative,  neurologic normal gait negative asterixis hard to  tell cognition otherwise because he communicates by signing that he is alert Lab Results  Component Value Date   WBC 5.4 11/19/2013   HGB 11.3* 11/19/2013   HCT 32.8* 11/19/2013   PLT 89.0* 11/19/2013   GLUCOSE 119* 11/19/2013   CHOL 134 08/16/2013   TRIG 91.0 08/16/2013   HDL 45.50 08/16/2013   LDLCALC 70 08/16/2013   ALT 27 08/16/2013   AST 32 08/16/2013   NA 132* 11/19/2013   K 3.8 11/19/2013   CL 101 11/19/2013   CREATININE 1.2 11/19/2013   BUN 15 11/19/2013   CO2 24 11/19/2013   TSH 3.30 05/07/2013   PSA 0.62 03/29/2008   INR 1.27 11/01/2011   HGBA1C 5.9 11/19/2013   Wt Readings from Last 3 Encounters:  11/26/13 202 lb (91.627 kg)  10/25/13 222 lb (100.699 kg)  08/20/13 219 lb (99.338 kg)    ASSESSMENT AND PLAN:  Discussed the following assessment and plan:  Cirrhosis of liver with ascites - Plan: DG Chest 2 View, 2D Echocardiogram without contrast  Diabetes type 2, controlled  Dyspnea - Plan: DG Chest 2 View, 2D Echocardiogram without contrast Has lost 20 pounds assuming mostly fluid looks a lot better today   Will trial of increase of Spirionolactone is his potassium is low normal in followup chemistry panel in 10-14 days. Fu as per [facility] Also get chest x-Donold and echocardiogram as discussed. Low-sodium diet discussed. Titrate the lactulose as discussed. We'll get daughter some information about neurologic c signs to look for. -Patient advised to return or notify health care team  if symptoms worsen ,persist or new concerns arise.  Patient Instructions  Fluid build up is better but not optimum  Because of  Problem   Would like to get   A chest x Khoa and echocardiogram to check heart muscle function  To see if fluid back up is causing you shortness of breath.   We can increase the spironolactone to  150 mg per day  For a week and then 200 mg per day  .    Check BMP in   2 weeks  We may increase lasix to 40 mg twice a day at that time .   Limit  Sodium in diet to 2000 mg per day    Usually no added salt.   Continue titrate ing the lactulose as we discussed.  No need to check ammonia level  Unless new concerns arise.       2 Gram Low Sodium Diet A 2 gram sodium diet restricts the amount of sodium in the diet to no more  than 2 g or 2000 mg daily. Limiting the amount of sodium is often used to help lower blood pressure. It is important if you have heart, liver, or kidney problems. Many foods contain sodium for flavor and sometimes as a preservative. When the amount of sodium in a diet needs to be low, it is important to know what to look for when choosing foods and drinks. The following includes some information and guidelines to help make it easier for you to adapt to a low sodium diet. QUICK TIPS  Do not add salt to food.  Avoid convenience items and fast food.  Choose unsalted snack foods.  Buy lower sodium products, often labeled as "lower sodium" or "no salt added."  Check food labels to learn how much sodium is in 1 serving.  When eating at a restaurant, ask that your food be prepared with less salt or none, if possible. READING FOOD LABELS FOR SODIUM INFORMATION The nutrition facts label is a good place to find how much sodium is in foods. Look for products with no more than 500 to 600 mg of sodium per meal and no more than 150 mg per serving. Remember that 2 g = 2000 mg. The food label may also list foods as:  Sodium-free: Less than 5 mg in a serving.  Very low sodium: 35 mg or less in a serving.  Low-sodium: 140 mg or less in a serving.  Light in sodium: 50% less sodium in a serving. For example, if a food that usually has 300 mg of sodium is changed to become light in sodium, it will have 150 mg of sodium.  Reduced sodium: 25% less sodium in a serving. For example, if a food that usually has 400 mg of sodium is changed to reduced sodium, it will have 300 mg of sodium. CHOOSING FOODS Grains  Avoid: Salted crackers and snack items. Some cereals,  including instant hot cereals. Bread stuffing and biscuit mixes. Seasoned rice or pasta mixes.  Choose: Unsalted snack items. Low-sodium cereals, oats, puffed wheat and rice, shredded wheat. English muffins and bread. Pasta. Meats  Avoid: Salted, canned, smoked, spiced, pickled meats, including fish and poultry. Bacon, ham, sausage, cold cuts, hot dogs, anchovies.  Choose: Low-sodium canned tuna and salmon. Fresh or frozen meat, poultry, and fish. Dairy  Avoid: Processed cheese and spreads. Cottage cheese. Buttermilk and condensed milk. Regular cheese.  Choose: Milk. Low-sodium cottage cheese. Yogurt. Sour cream. Low-sodium cheese. Fruits and Vegetables  Avoid: Regular canned vegetables. Regular canned tomato sauce and paste. Frozen vegetables in sauces. Olives. Rosita FirePickles. Relishes. Sauerkraut.  Choose: Low-sodium canned vegetables. Low-sodium tomato sauce and paste. Frozen or fresh vegetables. Fresh and frozen fruit. Condiments  Avoid: Canned and packaged gravies. Worcestershire sauce. Tartar sauce. Barbecue sauce. Soy sauce. Steak sauce. Ketchup. Onion, garlic, and table salt. Meat flavorings and tenderizers.  Choose: Fresh and dried herbs and spices. Low-sodium varieties of mustard and ketchup. Lemon juice. Tabasco sauce. Horseradish. SAMPLE 2 GRAM SODIUM MEAL PLAN Breakfast / Sodium (mg)  1 cup low-fat milk / 143 mg  2 slices whole-wheat toast / 270 mg  1 tbs heart-healthy margarine / 153 mg  1 hard-boiled egg / 139 mg  1 small orange / 0 mg Lunch / Sodium (mg)  1 cup raw carrots / 76 mg   cup hummus / 298 mg  1 cup low-fat milk / 143 mg   cup red grapes / 2 mg  1 whole-wheat pita bread / 356 mg Dinner /  Sodium (mg)  1 cup whole-wheat pasta / 2 mg  1 cup low-sodium tomato sauce / 73 mg  3 oz lean ground beef / 57 mg  1 small side salad (1 cup raw spinach leaves,  cup cucumber,  cup yellow bell pepper) with 1 tsp olive oil and 1 tsp red wine vinegar / 25  mg Snack / Sodium (mg)  1 container low-fat vanilla yogurt / 107 mg  3 graham cracker squares / 127 mg Nutrient Analysis  Calories: 2033  Protein: 77 g  Carbohydrate: 282 g  Fat: 72 g  Sodium: 1971 mg Document Released: 07/29/2005 Document Revised: 10/21/2011 Document Reviewed: 10/30/2009 Exit[employer] Patient Information 2014 [place], [place].      Ronald Jensen. Ronald Jensen M.D.  Pre visit review using our clinic review tool, if applicable. No additional management support is needed unless otherwise documented below in the visit note.

## 2013-11-26 NOTE — Patient Instructions (Signed)
Fluid build up is better but not optimum  Because of  Problem   Would like to get   A chest x Ronald Jensen and echocardiogram to check heart muscle function  To see if fluid back up is causing you shortness of breath.   We can increase the spironolactone to  150 mg per day  For a week and then 200 mg per day  .    Check BMP in   2 weeks  We may increase lasix to 40 mg twice a day at that time .   Limit  Sodium in diet to 2000 mg per day   Usually no added salt.   Continue titrate ing the lactulose as we discussed.  No need to check ammonia level  Unless new concerns arise.       2 Gram Low Sodium Diet A 2 gram sodium diet restricts the amount of sodium in the diet to no more than 2 g or 2000 mg daily. Limiting the amount of sodium is often used to help lower blood pressure. It is important if you have heart, liver, or kidney problems. Many foods contain sodium for flavor and sometimes as a preservative. When the amount of sodium in a diet needs to be low, it is important to know what to look for when choosing foods and drinks. The following includes some information and guidelines to help make it easier for you to adapt to a low sodium diet. QUICK TIPS  Do not add salt to food.  Avoid convenience items and fast food.  Choose unsalted snack foods.  Buy lower sodium products, often labeled as "lower sodium" or "no salt added."  Check food labels to learn how much sodium is in 1 serving.  When eating at a restaurant, ask that your food be prepared with less salt or none, if possible. READING FOOD LABELS FOR SODIUM INFORMATION The nutrition facts label is a good place to find how much sodium is in foods. Look for products with no more than 500 to 600 mg of sodium per meal and no more than 150 mg per serving. Remember that 2 g = 2000 mg. The food label may also list foods as:  Sodium-free: Less than 5 mg in a serving.  Very low sodium: 35 mg or less in a serving.  Low-sodium: 140 mg or less  in a serving.  Light in sodium: 50% less sodium in a serving. For example, if a food that usually has 300 mg of sodium is changed to become light in sodium, it will have 150 mg of sodium.  Reduced sodium: 25% less sodium in a serving. For example, if a food that usually has 400 mg of sodium is changed to reduced sodium, it will have 300 mg of sodium. CHOOSING FOODS Grains  Avoid: Salted crackers and snack items. Some cereals, including instant hot cereals. Bread stuffing and biscuit mixes. Seasoned rice or pasta mixes.  Choose: Unsalted snack items. Low-sodium cereals, oats, puffed wheat and rice, shredded wheat. English muffins and bread. Pasta. Meats  Avoid: Salted, canned, smoked, spiced, pickled meats, including fish and poultry. Bacon, ham, sausage, cold cuts, hot dogs, anchovies.  Choose: Low-sodium canned tuna and salmon. Fresh or frozen meat, poultry, and fish. Dairy  Avoid: Processed cheese and spreads. Cottage cheese. Buttermilk and condensed milk. Regular cheese.  Choose: Milk. Low-sodium cottage cheese. Yogurt. Sour cream. Low-sodium cheese. Fruits and Vegetables  Avoid: Regular canned vegetables. Regular canned tomato sauce and paste. Frozen vegetables in  sauces. Olives. Rosita FirePickles. Relishes. Sauerkraut.  Choose: Low-sodium canned vegetables. Low-sodium tomato sauce and paste. Frozen or fresh vegetables. Fresh and frozen fruit. Condiments  Avoid: Canned and packaged gravies. Worcestershire sauce. Tartar sauce. Barbecue sauce. Soy sauce. Steak sauce. Ketchup. Onion, garlic, and table salt. Meat flavorings and tenderizers.  Choose: Fresh and dried herbs and spices. Low-sodium varieties of mustard and ketchup. Lemon juice. Tabasco sauce. Horseradish. SAMPLE 2 GRAM SODIUM MEAL PLAN Breakfast / Sodium (mg)  1 cup low-fat milk / 143 mg  2 slices whole-wheat toast / 270 mg  1 tbs heart-healthy margarine / 153 mg  1 hard-boiled egg / 139 mg  1 small orange / 0 mg Lunch /  Sodium (mg)  1 cup raw carrots / 76 mg   cup hummus / 298 mg  1 cup low-fat milk / 143 mg   cup red grapes / 2 mg  1 whole-wheat pita bread / 356 mg Dinner / Sodium (mg)  1 cup whole-wheat pasta / 2 mg  1 cup low-sodium tomato sauce / 73 mg  3 oz lean ground beef / 57 mg  1 small side salad (1 cup raw spinach leaves,  cup cucumber,  cup yellow bell pepper) with 1 tsp olive oil and 1 tsp red wine vinegar / 25 mg Snack / Sodium (mg)  1 container low-fat vanilla yogurt / 107 mg  3 graham cracker squares / 127 mg Nutrient Analysis  Calories: 2033  Protein: 77 g  Carbohydrate: 282 g  Fat: 72 g  Sodium: 1971 mg Document Released: 07/29/2005 Document Revised: 10/21/2011 Document Reviewed: 10/30/2009 ExitCare Patient Information 2014 [employer] [place].

## 2013-11-29 ENCOUNTER — Other Ambulatory Visit: Payer: Self-pay | Admitting: Internal Medicine

## 2013-11-30 NOTE — Telephone Encounter (Signed)
Daughter would like you to know pt is completely out of this med lactulose, encephalopathy, (CHRONULAC) 10 GM/15ML SOLN and needs asap She would like a cb when this is done.

## 2013-12-09 ENCOUNTER — Encounter: Payer: Self-pay | Admitting: Cardiovascular Disease

## 2013-12-09 ENCOUNTER — Ambulatory Visit (HOSPITAL_COMMUNITY): Payer: Medicare Other | Attending: Cardiovascular Disease | Admitting: Radiology

## 2013-12-09 DIAGNOSIS — R0609 Other forms of dyspnea: Secondary | ICD-10-CM | POA: Insufficient documentation

## 2013-12-09 DIAGNOSIS — R0600 Dyspnea, unspecified: Secondary | ICD-10-CM

## 2013-12-09 DIAGNOSIS — R0989 Other specified symptoms and signs involving the circulatory and respiratory systems: Principal | ICD-10-CM | POA: Insufficient documentation

## 2013-12-09 DIAGNOSIS — K7460 Unspecified cirrhosis of liver: Secondary | ICD-10-CM

## 2013-12-09 DIAGNOSIS — R188 Other ascites: Secondary | ICD-10-CM | POA: Insufficient documentation

## 2013-12-09 DIAGNOSIS — R0602 Shortness of breath: Secondary | ICD-10-CM

## 2013-12-09 NOTE — Progress Notes (Signed)
Echocardiogram Performed. 

## 2013-12-10 ENCOUNTER — Other Ambulatory Visit (INDEPENDENT_AMBULATORY_CARE_PROVIDER_SITE_OTHER): Payer: Medicare Other

## 2013-12-10 DIAGNOSIS — I10 Essential (primary) hypertension: Secondary | ICD-10-CM

## 2013-12-10 LAB — BASIC METABOLIC PANEL
BUN: 14 mg/dL (ref 6–23)
CALCIUM: 8.7 mg/dL (ref 8.4–10.5)
CO2: 25 meq/L (ref 19–32)
CREATININE: 1.2 mg/dL (ref 0.4–1.5)
Chloride: 99 mEq/L (ref 96–112)
GFR: 63.54 mL/min (ref >60.00)
GLUCOSE: 178 mg/dL — AB (ref 70–99)
Potassium: 4.1 mEq/L (ref 3.5–5.1)
Sodium: 130 mEq/L — ABNORMAL LOW (ref 135–145)

## 2013-12-14 ENCOUNTER — Telehealth: Payer: Self-pay | Admitting: Family Medicine

## 2013-12-14 NOTE — Telephone Encounter (Signed)
Spoke to Selmaarole.  She stated the patient has been feeling very fatigued lately and would like to know if that could be from his medications. Also wanted to know when he should go for the next x-Jibreel.  The order is in the system.

## 2013-12-14 NOTE — Telephone Encounter (Signed)
Go for chest x Lesslie any time  Now.  Fatigue could be from medicines  Depends on what kind of fatigue.  When is his next [facility] appt. ?

## 2013-12-15 NOTE — Telephone Encounter (Signed)
Spoke to Comunasarol.  She said Ronald Jensen is sleeping all the time.  He stated that he is tired all the time.   [facility]  She will take him for x-Caydyn soon.

## 2014-01-04 ENCOUNTER — Ambulatory Visit (INDEPENDENT_AMBULATORY_CARE_PROVIDER_SITE_OTHER)
Admission: RE | Admit: 2014-01-04 | Discharge: 2014-01-04 | Disposition: A | Discharge status: Home / Self Care | Payer: Medicare Other | Source: Ambulatory Visit | Attending: Internal Medicine | Admitting: Internal Medicine

## 2014-01-04 DIAGNOSIS — R0989 Other specified symptoms and signs involving the circulatory and respiratory systems: Secondary | ICD-10-CM

## 2014-01-04 DIAGNOSIS — R0600 Dyspnea, unspecified: Secondary | ICD-10-CM

## 2014-01-04 DIAGNOSIS — R0609 Other forms of dyspnea: Secondary | ICD-10-CM

## 2014-01-04 DIAGNOSIS — R188 Other ascites: Principal | ICD-10-CM

## 2014-01-04 DIAGNOSIS — K7460 Unspecified cirrhosis of liver: Secondary | ICD-10-CM

## 2014-01-11 ENCOUNTER — Telehealth: Payer: Self-pay | Admitting: Internal Medicine

## 2014-01-11 NOTE — Telephone Encounter (Signed)
Spoke to Bright.  She wanted to know if Dr. Ophelia Charter office has faxed over the results of an abdominal ultrasound.

## 2014-01-11 NOTE — Telephone Encounter (Addendum)
Patient Information:  Caller Name: Okey Regal  Phone: [phone]  Patient: Ronald Jensen, Ronald Jensen  Gender: Male  DOB: 1941-01-10  Age: 73 Years  PCP: Berniece Andreas (Family Practice)  Office Follow Up:  Does the office need to follow up with this patient?: No  Instructions For The Office: N/A  RN Note:  Unable to complete triage -- Patient at [facility] - needing call back to continue discussing symptoms.  Agreed to call daughter back as requested  Symptoms  Reason For Call & Symptoms: Had noticed some unsteadiness on feet - appeared mildly off-balance - that appeared last week at appointment at [facility].  Last night 6/1 disagreement with another daughter over car and she noticed his hands shaking and he seemed very unsteady on his feet.  He told this daughter that he did not get unsteady until confrontation.  Today 6/2 was resting when she arrived to pick him up for appointment, jumped up and has appeared shakey since then  Reviewed Health History In EMR: No  Reviewed Medications In EMR: No  Reviewed Allergies In EMR: No  Reviewed Surgeries / Procedures: No  Date of Onset of Symptoms: 01/10/2014  Guideline(s) Used:  No Protocol Available - Sick Adult  Disposition Per Guideline:   See Within 3 Days in Office  Reason For Disposition Reached:   Nursing judgment  Advice Given:  N/A  Patient Will Follow Care Advice:  YES  Able to speak with daughter/Carol again.  Seemed very shaky when he awakened this am from sleep in different room of home than usual (said this bed more comfortable).  Notes some thirst, not monitoring blood sugars.  As day went on, seemed to be more steady.  Forgetting where he put his keys or wallet.  Would feel more comfortable to have him evaluated but unable to do so today.  Wanting Appointment tomorrow. Given appointment for tomorrow 6/3 at 2:30 pm Dr Fabian Sharp

## 2014-01-11 NOTE — Telephone Encounter (Signed)
Pt is needing results from his ultra sound that was done on last Monday.

## 2014-01-11 NOTE — Telephone Encounter (Signed)
i haven't seen it so far  Please find out about hist and what is going on

## 2014-01-11 NOTE — Telephone Encounter (Signed)
Spoke to the patient's daughter.  Advised if he gets worse than take him to the ED.  Will see him in the office on 01/12/2014

## 2014-01-12 ENCOUNTER — Encounter: Payer: Self-pay | Admitting: Internal Medicine

## 2014-01-12 ENCOUNTER — Ambulatory Visit (INDEPENDENT_AMBULATORY_CARE_PROVIDER_SITE_OTHER): Payer: Medicare Other | Admitting: Internal Medicine

## 2014-01-12 ENCOUNTER — Ambulatory Visit (INDEPENDENT_AMBULATORY_CARE_PROVIDER_SITE_OTHER)
Admission: RE | Admit: 2014-01-12 | Discharge: 2014-01-12 | Disposition: A | Discharge status: Home / Self Care | Payer: Medicare Other | Source: Ambulatory Visit | Attending: Internal Medicine | Admitting: Internal Medicine

## 2014-01-12 VITALS — BP 154/90 | HR 114 | Temp 97.5°F | Ht 71.75 in | Wt 192.0 lb

## 2014-01-12 DIAGNOSIS — R251 Tremor, unspecified: Secondary | ICD-10-CM

## 2014-01-12 DIAGNOSIS — R188 Other ascites: Secondary | ICD-10-CM

## 2014-01-12 DIAGNOSIS — K7460 Unspecified cirrhosis of liver: Secondary | ICD-10-CM

## 2014-01-12 DIAGNOSIS — K7290 Hepatic failure, unspecified without coma: Secondary | ICD-10-CM

## 2014-01-12 DIAGNOSIS — R259 Unspecified abnormal involuntary movements: Secondary | ICD-10-CM

## 2014-01-12 DIAGNOSIS — E119 Type 2 diabetes mellitus without complications: Secondary | ICD-10-CM

## 2014-01-12 DIAGNOSIS — R410 Disorientation, unspecified: Secondary | ICD-10-CM

## 2014-01-12 DIAGNOSIS — K7682 Hepatic encephalopathy: Secondary | ICD-10-CM

## 2014-01-12 DIAGNOSIS — F05 Delirium due to known physiological condition: Secondary | ICD-10-CM

## 2014-01-12 LAB — POCT URINALYSIS DIP (MANUAL ENTRY)
Bilirubin, UA: NEGATIVE
GLUCOSE UA: NEGATIVE
Ketones, POC UA: NEGATIVE
Leukocytes, UA: NEGATIVE
Nitrite, UA: NEGATIVE
Protein Ur, POC: NEGATIVE
RBC UA: NEGATIVE
SPEC GRAV UA: 1.02
Urobilinogen, UA: 1
pH, UA: 5.5

## 2014-01-12 LAB — HEPATIC FUNCTION PANEL
ALK PHOS: 172 U/L — AB (ref 39–117)
ALT: 34 U/L (ref 0–53)
AST: 37 U/L (ref 0–37)
Albumin: 2.6 g/dL — ABNORMAL LOW (ref 3.5–5.2)
BILIRUBIN DIRECT: 0.7 mg/dL — AB (ref 0.0–0.3)
BILIRUBIN TOTAL: 2.3 mg/dL — AB (ref 0.2–1.2)
Total Protein: 6.3 g/dL (ref 6.0–8.3)

## 2014-01-12 LAB — CBC WITH DIFFERENTIAL/PLATELET
Basophils Absolute: 0 10*3/uL (ref 0.0–0.1)
Basophils Relative: 0.2 % (ref 0.0–3.0)
Eosinophils Absolute: 0.1 10*3/uL (ref 0.0–0.7)
Eosinophils Relative: 1.1 % (ref 0.0–5.0)
HCT: 34.5 % — ABNORMAL LOW (ref 39.0–52.0)
HEMOGLOBIN: 11.7 g/dL — AB (ref 13.0–17.0)
LYMPHS PCT: 8.9 % — AB (ref 12.0–46.0)
Lymphs Abs: 0.5 10*3/uL — ABNORMAL LOW (ref 0.7–4.0)
MCHC: 33.9 g/dL (ref 30.0–36.0)
MCV: 92.1 fl (ref 78.0–100.0)
MONO ABS: 0.7 10*3/uL (ref 0.1–1.0)
Monocytes Relative: 13.2 % — ABNORMAL HIGH (ref 3.0–12.0)
NEUTROS ABS: 4.3 10*3/uL (ref 1.4–7.7)
Neutrophils Relative %: 76.6 % (ref 43.0–77.0)
Platelets: 101 10*3/uL — ABNORMAL LOW (ref 150.0–400.0)
RBC: 3.75 Mil/uL — AB (ref 4.22–5.81)
RDW: 14.9 % (ref 11.5–15.5)
WBC: 5.7 10*3/uL (ref 4.0–10.5)

## 2014-01-12 LAB — BASIC METABOLIC PANEL
BUN: 17 mg/dL (ref 6–23)
CO2: 24 mEq/L (ref 19–32)
Calcium: 9 mg/dL (ref 8.4–10.5)
Chloride: 99 mEq/L (ref 96–112)
Creatinine, Ser: 1.2 mg/dL (ref 0.4–1.5)
GFR: 62.33 mL/min (ref >60.00)
Glucose, Bld: 159 mg/dL — ABNORMAL HIGH (ref 70–99)
POTASSIUM: 4.2 meq/L (ref 3.5–5.1)
SODIUM: 130 meq/L — AB (ref 135–145)

## 2014-01-12 LAB — MAGNESIUM: MAGNESIUM: 1.7 mg/dL (ref 1.5–2.5)

## 2014-01-12 NOTE — Progress Notes (Signed)
Chief Complaint  Patient presents with  . Tremors  . Confused  . Leg Cramps  . Gait Problem    HPI: Patient comes in today for SDA for   problem evaluation. With daughter who signs interprets See phone note   Had an [facility]ound [facility] Chestine Sporelark  And visit recenetly But then over the last few weeks he's been a little more memory impaired and over the last couple days Shaking  At times   tremporing ; Yesterday worse  . Hard to write a check . Didn't remember that he had done something memory issues . Is a bit better today according to daughter not shaking Onset less than 1 week and now worse.  No fever chest pain shortness of breath falling that he is aware of bleeding excess swelling  ROS: See pertinent positives and negatives per HPI. Stools down to 2 a day. No specific headaches or vision changes has been losing weight still living alone  Past Medical History  Diagnosis Date  . Migraine   . Hyperlipidemia   . History of chickenpox   . Cirrhosis of liver not due to alcohol     [facility]     Family History  Problem Relation Age of Onset  . Hearing loss      2 brothers   . Stroke      3 brothers ages [id number] and another in his 7460s.  . Alcohol abuse Father     Died age 71 from complications of respiratory exposure.  . Diabetes Mother   . Diabetes Brother   . Osteoarthritis Mother     History   Social History  . Marital Status: Married    Spouse Name: N/A    Number of Children: N/A  . Years of Education: N/A   Social History Main Topics  . Smoking status: Never Smoker   . Smokeless tobacco: Never Used  . Alcohol Use: No  . Drug Use: No  . Sexual Activity: None   Other Topics Concern  . None   Social History Narrative   Widowed  2 year   Became deaf at age 25   Retired from the [employer]   Neg tad remote tobacco  Age 73- 3520    HH o f 3  No pets  Daughter with downs died last year 2013    6 hours sleep       Neg  ets fa  etoh     Upper dentures     Outpatient Encounter Prescriptions as of 01/12/2014  Medication Sig  . desonide (DESOWEN) 0.05 % cream Apply topically 2 (two) times daily.  . furosemide (LASIX) 40 MG tablet TAKE 1 TABLET BY MOUTH EVERY DAY  . GENERLAC 10 GM/15ML SOLN TAKE 30- 45 MLS ONE TO THREE TIMES A DAY; ADJUST DOSE EVERY 1-2 DAYS TO PRODUCE 2-3 SOFT STOOLS A DAY  . omeprazole (PRILOSEC) 40 MG capsule   . spironolactone (ALDACTONE) 50 MG tablet Take 150-200 mg by mouth daily.  . [DISCONTINUED] furosemide (LASIX) 40 MG tablet TAKE 1 TABLET BY MOUTH EVERY DAYcan increase to 1 po bid  . [DISCONTINUED] lactulose, encephalopathy, (CHRONULAC) 10 GM/15ML SOLN Take 10ml TID to produce 2-3 bowel movements daily.    EXAM:  BP 154/90  Pulse 114  Temp(Src) 97.5 F (36.4 C) (Oral)  Ht 5' 11.75" (1.822 m)  Wt 192 lb (87.091 kg)  BMI 26.23 kg/m2  SpO2 98%  Body  mass index is 26.23 kg/(m^2).  GENERAL: vitals reviewed and listed above, alert,, appears well hydrated and in no acute distress looks thinner more waisted  but alert  And interactive  HEENT: atraumatic, conjunctiva  clear, no obvious abnormalities on inspection of external nose and ears OP : no lesion edema or exudate tongue is midline NECK: no obvious masses on inspection palpation no JVD LUNGS: clear to auscultation bilaterally, no wheezes, rales or rhonchi, possible few crackles at base CV: HRRR, pulse 90 regular no clubbing cyanosis NO  peripheral edema nl cap refill  Abdomen large soft hernia nontender MS: moves all extremities without noticeable focal  abnormality PSYCH: pleasant and cooperative, no obvious depression or anxiety TURP rotation through daughter Neurologic appears grossly nonfocal no gross asterixis  But fine tremor on intention  Ambulatory no focal weakness Skin no acute bruising    ASSESSMENT AND PLAN:  Discussed the following assessment and plan:  Shaking - tremor may have had asterixis  yesterday  -  Plan: Basic metabolic panel, Magnesium, Hepatic function panel, AMMONIA, POCT urinalysis dipstick, CBC with Differential, CT Head Wo Contrast  Cirrhosis of liver with ascites - Plan: Basic metabolic panel, Magnesium, Hepatic function panel, AMMONIA, POCT urinalysis dipstick, CBC with Differential, CT Head Wo Contrast  Subacute confusional state - hard to assess but seems better today forgetful as per daughter - Plan: CT Head Wo Contrast  Diabetes type 2, controlled - Plan: Basic metabolic panel, Magnesium, Hepatic function panel, AMMONIA, POCT urinalysis dipstick, CBC with Differential  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  plan fu dependong on labs and ct and how he is doing.   -Patient advised to return or notify health care team  if symptoms worsen ,persist or new concerns arise.  Patient Instructions  Increase the lactulose to 15 15 15   Will notify you  of labs when available. Ct scan of head Will send copies to [facility] and may need to fu with them after ward.   Neta Mends. Panosh M.D.  Pre visit review using our clinic review tool, if applicable. No additional management support is needed unless otherwise documented below in the visit note.

## 2014-01-12 NOTE — Telephone Encounter (Signed)
Printed results of ultrasound of Care Everywhere.  Given to Dr. Fabian Sharp.  Reviewed in OV today.

## 2014-01-12 NOTE — Patient Instructions (Signed)
Increase the lactulose to 15 15 15   Will notify you  of labs when available. Ct scan of head Will send copies to [facility] and may need to fu with them after ward.

## 2014-01-13 LAB — AMMONIA: Ammonia: 87 umol/L — ABNORMAL HIGH (ref 16–53)

## 2014-02-01 ENCOUNTER — Ambulatory Visit: Payer: Medicare Other | Admitting: Internal Medicine

## 2014-02-13 ENCOUNTER — Other Ambulatory Visit: Payer: Self-pay | Admitting: Internal Medicine

## 2014-02-14 ENCOUNTER — Telehealth: Payer: Self-pay | Admitting: Internal Medicine

## 2014-02-14 NOTE — Telephone Encounter (Signed)
Okey RegalCarol notified that rx has been sent to the pharmacy.

## 2014-02-14 NOTE — Telephone Encounter (Signed)
Pt is out as of today of GENERLAC 10 GM/15ML SOLN pls send to [facility]/ [address] Pt has had to increase this med . Pt needs new rx for this drug for increased amount of 15mg  3 X day  Okey RegalCarol would like confirmation call once this has been done

## 2014-02-14 NOTE — Telephone Encounter (Signed)
Sent to the pharmacy by e-scribe. 

## 2014-03-03 ENCOUNTER — Telehealth: Payer: Self-pay | Admitting: Internal Medicine

## 2014-03-03 NOTE — Telephone Encounter (Signed)
Spoke with pt's daughter and [facility] and they are suspecting his levels are too high and pt could possibly be admitted and miss appt for 7/24

## 2014-03-03 NOTE — Telephone Encounter (Signed)
Patient Information:  Caller Name: Okey RegalCarol  Phone: [phone]  Patient: Ronald Jensen, Ronald Jensen  Gender: Male  DOB: 02/22/1941  Age: 73 Years  PCP: Berniece AndreasPanosh, Wanda (Family Practice)  Office Follow Up:  Does the office need to follow up with this patient?: No  Instructions For The Office: N/A  RN Note:  Pt is deaf; uses sign language. Daughter unable to understand his sign language today that is normally coherent. Noted these symptoms present in last few days and worse when first gets up for about 1 hour. Diabetic; does not check blood sugar.  Slept late today until 1330.  Took his meds but has not eaten.  Was unable to sit up straight last night.  Increased fatigue.  Some agitation present.  Declined 911 because is currently, in car her on way to ED. [facility] since that is where he sees his liver specialist. Informed MD prefers [facility] and they will have access to EMR, but 911 would take him to closest hospital. Verbalized understanding.  Symptoms  Reason For Call & Symptoms: More off balance than normal, mild confusion, staggered and stumbling whilewalking and plopped down in chair without control.  History of non alcoholic cirrhosis of liver. Asking if should be seen in ED or office; currenly driving towards ED.  Reviewed Health History In EMR: Yes  Reviewed Medications In EMR: Yes  Reviewed Allergies In EMR: Yes  Reviewed Surgeries / Procedures: Yes  Date of Onset of Symptoms: 03/02/2014  Guideline(s) Used:  Neurologic Deficit  Disposition Per Guideline:   Call EMS 911 Now  Reason For Disposition Reached:   New neurologic deficit that is present NOW, sudden onset of ANY of the following:   Weakness of the face, arm, or leg on one side of the body  Numbness of the face, arm, or leg on one side of the body  Loss of speech or garbled speech  Advice Given:  N/A  Patient Refused Recommendation:  Patient Will Go To ED  [facility] by [facility].

## 2014-03-03 NOTE — Telephone Encounter (Signed)
Attempted to call pts daughter. No answer

## 2014-03-03 NOTE — Telephone Encounter (Signed)
Noted  

## 2014-03-03 NOTE — Telephone Encounter (Signed)
I advise [facility] because could be liver related  And we can get care everywhere info.   Please advise CAN of this access.

## 2014-03-04 ENCOUNTER — Ambulatory Visit: Payer: Medicare Other | Admitting: Internal Medicine

## 2014-03-06 ENCOUNTER — Other Ambulatory Visit: Payer: Self-pay | Admitting: Internal Medicine

## 2014-03-15 ENCOUNTER — Telehealth: Payer: Self-pay | Admitting: Internal Medicine

## 2014-03-15 NOTE — Telephone Encounter (Signed)
Spoke to Ojo Encinoarol.  She wanted WP to know [facility] lasix to 20 mg daily and his spirolactone to 100 mg daily.  She reports that she thinks he is retaining fluid.  He is drinking frequently.  She reports that at different times of the day that he has fluid seeping from his skin.  This is wetting his clothes.  Okey RegalCarol is very concerned about this.  Abdomen appears to be more swollen.  Please advise.  Thanks!

## 2014-03-15 NOTE — Telephone Encounter (Signed)
Per carol doctor earle at [facility] would like pt to be seen this wk. Pt was discharge [facility]. [facility]os call and made appt and was given 04-04-14 at 145pm

## 2014-03-16 NOTE — Telephone Encounter (Signed)
Spoke with carol e Sob and enlarging abdomen  And weeping  Fluid  Mental status is still better than hospitalization.   Ed physician evaluating   Will contact later. about fu plan.   ( review of available record said he was on ACEI   at hospital but he was taken off this med when he presented with acute ascites  3 13 15  ,   )   I do not know if it was restarted by another provider ?   .Marland Kitchen

## 2014-03-16 NOTE — Telephone Encounter (Signed)
Left a message for Enid DerryCarole to return my call.

## 2014-03-16 NOTE — Telephone Encounter (Signed)
Patient Information:  Caller Name: Enid DerryCarole  Phone: [phone]  Patient: Ronald Jensen, Ronald Jensen  Gender: Male  DOB: 02/08/1941  Age: 73 Years  PCP: Berniece AndreasPanosh, Wanda [facility](Family Practice)  Office Follow Up:  Does the office need to follow up with this patient?: Yes  Instructions For The Office: Requesting callback from Dr. Fabian SharpPanosh.   Symptoms  Reason For Call & Symptoms: Enid DerryCarole / Daughter states Father/Brolin was [facility] due to dehydration and confusion.  States he was discharged on 03/12/14. States he onset of weeping from abdomen and abdominal distension on 03/14/14.  Lasix and Spironolactone were  stopped while in hospital .  States he was ordered Lasix and Spironolactone to start after discharge - 1/2 previous dose.  Has cough that has worsened since discharge. [facility]. Short of breath. States she called office on 03/15/14 and has not had return call thus far. Needs follow up appointment with Dr. Fabian SharpPanosh. Was told he could not be seen until 04/04/14. States she [facility] with [facility] today 8/5  who advised Fransico be seen in ED. Per breathing difficulty protocol has go to ED NOW dispositiion due to moderate difficulty breathing. Advised to go to ED. States she [facility]. Requesting callback  to Carole's Cell [phone] as soon as possible.  Reviewed Health History In EMR: Yes  Reviewed Medications In EMR: Yes  Reviewed Allergies In EMR: Yes  Reviewed Surgeries / Procedures: Yes  Date of Onset of Symptoms: 03/14/2014  Guideline(s) Used:  Breathing Difficulty  Disposition Per Guideline:   Go to ED Now  Reason For Disposition Reached:   Moderate difficulty breathing (e.Jensen., speaks in phrases, SOB even at rest, pulse 100-120) of new onset or worse than normal  Advice Given:  N/A  Patient Will Follow Care Advice:  YES

## 2014-03-16 NOTE — Telephone Encounter (Signed)
Ronald Jensen called back and left a message that [facility].

## 2014-04-04 ENCOUNTER — Ambulatory Visit (INDEPENDENT_AMBULATORY_CARE_PROVIDER_SITE_OTHER): Payer: Medicare Other | Admitting: Internal Medicine

## 2014-04-04 ENCOUNTER — Encounter: Payer: Self-pay | Admitting: Internal Medicine

## 2014-04-04 VITALS — BP 124/60 | Temp 97.5°F | Ht 71.75 in | Wt 193.0 lb

## 2014-04-04 DIAGNOSIS — K7460 Unspecified cirrhosis of liver: Secondary | ICD-10-CM

## 2014-04-04 DIAGNOSIS — E119 Type 2 diabetes mellitus without complications: Secondary | ICD-10-CM | POA: Insufficient documentation

## 2014-04-04 DIAGNOSIS — R0609 Other forms of dyspnea: Secondary | ICD-10-CM

## 2014-04-04 DIAGNOSIS — L84 Corns and callosities: Secondary | ICD-10-CM

## 2014-04-04 DIAGNOSIS — J8410 Pulmonary fibrosis, unspecified: Secondary | ICD-10-CM | POA: Insufficient documentation

## 2014-04-04 DIAGNOSIS — R0600 Dyspnea, unspecified: Secondary | ICD-10-CM

## 2014-04-04 DIAGNOSIS — I10 Essential (primary) hypertension: Secondary | ICD-10-CM

## 2014-04-04 DIAGNOSIS — R0989 Other specified symptoms and signs involving the circulatory and respiratory systems: Secondary | ICD-10-CM

## 2014-04-04 DIAGNOSIS — R188 Other ascites: Principal | ICD-10-CM

## 2014-04-04 NOTE — Patient Instructions (Signed)
Last labs look stable .  continue to have  [place] receive labs on care every where. No other change in meds   Get the   High dose flu vaccine when available.  ROV in   In 3 months or as needed.

## 2014-04-04 NOTE — Progress Notes (Signed)
Pre visit review using our clinic review tool, if applicable. No additional management support is needed unless otherwise documented below in the visit note.  Chief Complaint  Patient presents with  . 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   also put on   Rifaximin  But only taking once a day  . Now is living with daughter who is trying to help him stay on 100 mg sodium diet which has been difficult. They're monitoring his weight will get new attending  Cause prev doc "graduated"   Saw  Pulm alist  And did  6 montues   Walking tests   But hard to do spirometry and to have high resolutioh  Ct and to fu.  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 Asks for a handicap sticker for the core. Also asks about a power scooter so that he can be more mobile and go outside. Perform or ADLs. He is currently receiving physical therapy at home. ROS: See pertinent positives and negatives per HPI.minor cough no nvd has good appetite  No numbness of feet lhas a  Corn callus   Rash ? From new soap cream doesn't help that much  Itchy back and trunck. Feels encephalopathic sx are better    Past Medical History  Diagnosis Date  . Migraine   . Hyperlipidemia   . History of chickenpox   . Cirrhosis of liver not due to alcohol     sees Dr. Rudean Hitt at [facility] History  Problem Relation Age of Onset  . Hearing loss      2 brothers   . Stroke      3 brothers ages 44 and another in his 2s.  . Alcohol abuse Father     Died age 73 from complications of respiratory exposure.  . Diabetes Mother   . Diabetes Brother   .  Osteoarthritis Mother     History   Social History  . Marital Status: Married    Spouse Name: N/A    Number of Children: N/A  . Years of Education: N/A   Social History Main Topics  . Smoking status: Never Smoker   . Smokeless tobacco: Never Used  . Alcohol Use: No  . Drug Use: No  . Sexual Activity: None   Other Topics Concern  . 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 desonide (DESOWEN) 0.05 % cream Apply topically 2 (two) times daily.  Marland Kitchen  furosemide (LASIX) 40 MG tablet TAKE 1 TABLET BY MOUTH EVERY DAY  . GENERLAC 10 GM/15ML SOLN TAKE 30-45 ML BY MOUTH ONE TO THREE TIMES A DAY, ADJUST DOSE EVERY 1-2 DAYS TO PRODUCE 2-3 SOFT STOOLS A DAY  . omeprazole (PRILOSEC) 40 MG capsule   . rifaximin (XIFAXAN) 550 MG TABS tablet Take 550 mg by mouth 2 (two) times daily.   Marland Kitchen spironolactone (ALDACTONE) 50 MG tablet Take 150-200 mg by mouth daily.    EXAM:  BP 124/60  Temp(Src) 97.5 F (36.4 C) (Oral)  Ht 5' 11.75" (1.822 m)  Wt 193 lb (87.544 kg)  BMI 26.37 kg/m2 Wt Readings from Last 3 Encounters:  04/04/14 193 lb (87.544 kg)  01/12/14 192 lb (87.091 kg)  11/26/13 202 lb (91.627 kg)    Body mass index is 26.37 kg/(m^2).  GENERAL: vitals reviewed and listed above, alert, oriented, appears well hydrated and in no acute distress with obvious ascites minimal icteris today?  HEENT: atraumatic, conjunctiva  clear, no obvious abnormalities on inspection of external nose and ears OP : no lesion edema or exudate  NECK: no obvious masses on inspection palpation  No jvd ocass cough LUNGS:bilateral crackles velcro like right more than right  CV: HRRR, no  clubbing cyanosis or  peripheral edema nl cap refill  abd large soft ascites no pain masses  MS: moves all extremities without noticeable focal  Abnormality Skin patchy redness right trunk no vesicles crusting Foot exam  sens intact left  Sole large corn with some  craking 1 cm no infection  PSYCH: pleasant and cooperative, Lab Results  Component Value Date   WBC 5.7 01/12/2014   HGB 11.7* 01/12/2014   HCT 34.5* 01/12/2014   PLT 101.0* 01/12/2014   GLUCOSE 159* 01/12/2014   CHOL 134 08/16/2013   TRIG 91.0 08/16/2013   HDL 45.50 08/16/2013   LDLCALC 70 08/16/2013   ALT 34 01/12/2014   AST 37 01/12/2014   NA 130* 01/12/2014   K 4.2 01/12/2014   CL 99 01/12/2014   CREATININE 1.2 01/12/2014   BUN 17 01/12/2014   CO2 24 01/12/2014   TSH 3.30 05/07/2013   PSA 0.62 03/29/2008   INR 1.27 11/01/2011   HGBA1C 5.9 11/19/2013   Reviewed 3 documents and care every where  reviewed labs available with daughter and pt   ASSESSMENT AND PLAN:  Discussed the following assessment and plan:  Cirrhosis of liver with ascites, unspecified hepatic cirrhosis type  Type II or unspecified type diabetes mellitus without mention of complication, not stated as uncontrolled - controlled at present - Plan: Ambulatory referral to Podiatry  Dyspnea  Unspecified essential hypertension  Pulmonary fibrosis - probable idopathic under eval ok to ge handicapped tag and order for pwer scooter   Pre-ulcerative corn or callous - left foot  advise refer to podiatry - Plan: Ambulatory referral to Podiatry Dc summary says was only on 50 spironolactone but  Was on   at last visit  And also has not been on lisinopril since it was dced .  I agree with handicap sticker tag signed form also prescription for power scooter he has significant respiratory limitation at this time and should be able to handle a power scooter which will help his mobility and continue with some ADLs. Currently no active bleeding advice high-dose flu vaccine when available. RV  in 3 months. Moisturizers to rash. If needed call for more potent steroid rash. We'll refer podiatry appointment because of the cracking around the  corn on his left foot a -Patient advised to return or notify health care team  if symptoms worsen ,persist or new concerns arise.  Patient Instructions  Last labs look stable .  continue to have  [place] receive labs on care every where. No other change in meds   Get the   High dose flu vaccine when available.  ROV in   In 3 months or as needed.    Neta Mends. Ileta Ofarrell M.D. Total visit 40 mins > 50% spent counseling and coordinating care  Daughter can bring in FMLA form when needed as she is the transporter translator and helper caretaking

## 2014-04-05 ENCOUNTER — Telehealth: Payer: Self-pay | Admitting: Internal Medicine

## 2014-04-05 NOTE — Telephone Encounter (Signed)
Relevant patient education mailed to patient.  

## 2014-04-11 ENCOUNTER — Telehealth: Payer: Self-pay | Admitting: Internal Medicine

## 2014-04-11 ENCOUNTER — Other Ambulatory Visit: Payer: Self-pay | Admitting: Internal Medicine

## 2014-04-11 NOTE — Telephone Encounter (Signed)
See pharmacy refill request.  Sent to Dr. Fabian Sharp for authorization.  This note not needed.

## 2014-04-11 NOTE — Telephone Encounter (Signed)
Pt request refill of the following:   spironolactone (ALDACTONE) 50 MG tablet, GENERLAC 10 GM/15ML SOLN      Phamacy:  [facility] [address] [place]

## 2014-04-12 ENCOUNTER — Telehealth: Payer: Self-pay | Admitting: Internal Medicine

## 2014-04-12 NOTE — Telephone Encounter (Signed)
Sent to the pharmacy by e-scribe.  Per WP, okay to send enough for 6 months.

## 2014-04-12 NOTE — Telephone Encounter (Signed)
Sent to the pharmacy by e-scribe.  See refill encounter. 

## 2014-04-12 NOTE — Telephone Encounter (Signed)
Pt following up on rx request refill GENERLAC 10 GM/15ML SOLN And  spironolactone (ALDACTONE) 50 MG tablet Pt is out of meds  [facility] [place] [address]

## 2014-04-25 ENCOUNTER — Other Ambulatory Visit: Payer: Self-pay | Admitting: Internal Medicine

## 2014-04-25 ENCOUNTER — Telehealth: Payer: Self-pay | Admitting: Internal Medicine

## 2014-04-25 NOTE — Telephone Encounter (Signed)
Pt daughter is calling checking on status of FMLA paperwork

## 2014-04-25 NOTE — Telephone Encounter (Signed)
Ok to refill x 6 months 

## 2014-04-25 NOTE — Telephone Encounter (Signed)
Sent to the pharmacy by e-scribe. 

## 2014-04-26 NOTE — Telephone Encounter (Signed)
Working on it and will be ready by Wednesday.

## 2014-04-26 NOTE — Telephone Encounter (Signed)
Left a message on Ronald Jensen's cell informing her that the paper work will be ready by Wednesday.  Instructed her to call back if needed.

## 2014-04-27 ENCOUNTER — Encounter: Payer: Self-pay | Admitting: Internal Medicine

## 2014-04-27 NOTE — Progress Notes (Signed)
FMLA form completed for Ronald Jensen HUmble daughter and deaf interpreter for father

## 2014-05-02 ENCOUNTER — Telehealth: Payer: Self-pay | Admitting: Internal Medicine

## 2014-05-02 NOTE — Telephone Encounter (Signed)
Patient's lactose meds ran out before insurance would pay for it again.  So could you refill the prescription and change the amount to 20ml 3x's a day.  Any questions, please call.

## 2014-05-03 MED ORDER — LACTULOSE 20 GM/30ML PO SOLN: ORAL | Status: DC

## 2014-05-03 NOTE — Telephone Encounter (Signed)
Pls advise.  

## 2014-05-03 NOTE — Telephone Encounter (Signed)
Pt's daughter aware.

## 2014-05-03 NOTE — Telephone Encounter (Signed)
Rx sent to pharmacy   

## 2014-05-03 NOTE — Telephone Encounter (Signed)
Call in Lactulose 20 ml TID, one month supply only

## 2014-05-23 ENCOUNTER — Ambulatory Visit (INDEPENDENT_AMBULATORY_CARE_PROVIDER_SITE_OTHER): Payer: Medicare Other | Admitting: Internal Medicine

## 2014-05-23 ENCOUNTER — Encounter: Payer: Self-pay | Admitting: Internal Medicine

## 2014-05-23 VITALS — BP 134/66 | Temp 98.0°F | Ht 71.75 in | Wt 187.9 lb

## 2014-05-23 DIAGNOSIS — Z23 Encounter for immunization: Secondary | ICD-10-CM

## 2014-05-23 DIAGNOSIS — I10 Essential (primary) hypertension: Secondary | ICD-10-CM

## 2014-05-23 DIAGNOSIS — E119 Type 2 diabetes mellitus without complications: Secondary | ICD-10-CM

## 2014-05-23 DIAGNOSIS — K429 Umbilical hernia without obstruction or gangrene: Secondary | ICD-10-CM

## 2014-05-23 DIAGNOSIS — R188 Other ascites: Principal | ICD-10-CM

## 2014-05-23 DIAGNOSIS — K7460 Unspecified cirrhosis of liver: Secondary | ICD-10-CM

## 2014-05-23 DIAGNOSIS — J8410 Pulmonary fibrosis, unspecified: Secondary | ICD-10-CM

## 2014-05-23 LAB — BASIC METABOLIC PANEL
BUN: 21 mg/dL (ref 6–23)
CHLORIDE: 89 meq/L — AB (ref 96–112)
CO2: 22 mEq/L (ref 19–32)
Calcium: 8.4 mg/dL (ref 8.4–10.5)
Creatinine, Ser: 1.2 mg/dL (ref 0.4–1.5)
GFR: 64.7 mL/min (ref >60.00)
Glucose, Bld: 172 mg/dL — ABNORMAL HIGH (ref 70–99)
Potassium: 5.1 mEq/L (ref 3.5–5.1)
Sodium: 119 mEq/L — CL (ref 135–145)

## 2014-05-23 LAB — HEMOGLOBIN A1C: HEMOGLOBIN A1C: 5.9 % (ref 4.6–6.5)

## 2014-05-23 MED ORDER — SPIRONOLACTONE 50 MG PO TABS: ORAL_TABLET | ORAL | Status: AC

## 2014-05-23 MED ORDER — LACTULOSE 20 GM/30ML PO SOLN: ORAL | Status: AC

## 2014-05-23 NOTE — Patient Instructions (Signed)
Will notify you  of labs when available.

## 2014-05-23 NOTE — Progress Notes (Signed)
Pre visit review using our clinic review tool, if applicable. No additional management support is needed unless otherwise documented below in the visit note.   Chief Complaint  Patient presents with  . Follow-up    meds  etc     HPI: Ronald Jensen come in with daughter today  For fu multiple medical issues   Respiratory pulmonary fibrosis : Cough worse   Pulmonary  Has een him and testing   For Monday.  Gave cough med and inhaler  And said ? Pulmonary rehab. No orders yet  Cirrhosis with ascites: Has had   2 paracentesis since last time  fluid seems to collect back Back in 2-3 days   CNS about the same he doesn't feel disoriented although she states he has his moments. Living in daughter's house adapting  Taking lactulose and xifaxaman    Ran out  Of  20 ml qid   ocass diarrhea. Patient tells a starter that he doesn't want to consider liver transplant. Says he is too old for this. No swelling in his feet taking Lasix 1 tablet every day occasional extra if he gains a lot of weight. He is on 150 mg of spironolactone /day.  Fall about a month  atgo .  Not weaker than before .   More concerned about his abdominal hernia but has no pain it seems to be sticking out more. No vomiting  Nurse coming out .  Home health to check vital signs other daughter notices he is wasted a bit more And arms are thinner . But he is  Eating well .  Not really checking sugar or blood pressure otherwise at this point. ROS: See pertinent positives and negatives per HPI. No active bleeding  Past Medical History  Diagnosis Date  . Migraine   . Hyperlipidemia   . History of chickenpox   . Cirrhosis of liver not due to alcohol     [facility]     Family History  Problem Relation Age of Onset  . Hearing loss      2 brothers   . Stroke      3 brothers ages [id number] and another in his 3260s.  . Alcohol abuse Father     Died age 71 from complications of respiratory exposure.  . Diabetes Mother    . Diabetes Brother   . Osteoarthritis Mother     History   Social History  . Marital Status: Married    Spouse Name: N/A    Number of Children: N/A  . Years of Education: N/A   Social History Main Topics  . Smoking status: Never Smoker   . Smokeless tobacco: Never Used  . Alcohol Use: No  . Drug Use: No  . Sexual Activity: None   Other Topics Concern  . 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 BENZONATATE PO Take by mouth.  . desonide (DESOWEN) 0.05 % cream Apply topically 2 (two) times daily.  .Marland Kitchen  diphenhydrAMINE (BENADRYL) 25 MG tablet Take 25 mg by mouth every 6 (six) hours as needed.  . furosemide (LASIX) 40 MG tablet TAKE 1 TABLET EVERY DAY  . Lactulose 20 GM/30ML SOLN TAKE 20ML BY MOUTH  4 TIMES DAILY-  . omeprazole (PRILOSEC) 40 MG capsule   . rifaximin (XIFAXAN) 550 MG TABS tablet Take 550 mg by mouth daily.   Marland Kitchen. spironolactone (ALDACTONE) 50 MG tablet Take 150 mg per day or as directed  . [DISCONTINUED] Lactulose 20 GM/30ML SOLN TAKE 30-45 ML BY MOUTH 3 TIMES DAILY-ADJUST DOSE EVERY 1-2 DAYS FOR 2-3 SOFT STOOLS DAILY  . [DISCONTINUED] spironolactone (ALDACTONE) 50 MG tablet TAKE 3 TABS DAILY FOR 3-4 DAYS,THEN INCREASE TO 4 TABS DAILY AS TOLERATED    EXAM:  BP 134/66  Temp(Src) 98 F (36.7 C) (Oral)  Ht 5' 11.75" (1.822 m)  Wt 187 lb 14.4 oz (85.231 kg)  BMI 25.67 kg/m2  Body mass index is 25.67 kg/(m^2).  GENERAL: vitals reviewed and listed above, alert, oriented, appears well hydrated and in no acute distress some wasting nl color  Daughter signing  HEENT: atraumatic, conjunctiva  clear, no obvious abnormalities on inspection of  external nose and ears NECK: no obvious masses on inspection palpation  Neg jvd  LUNGS: bilateral velcro sounds mid lung fields down  No rales otherwises  CV: HRRR, no clubbing cyanosis slight to no peripheral edema nl cap refill  ABD hernia around the umbilicus and ventral diastesis  When standing  collapses when laying  No pain or mass effect  MS: moves all extremities without noticeable focal  abnormality PSYCH: pleasant and cooperative,  daughter is signing. Gait is steady and appears nonfocal. Wt Readings from Last 3 Encounters:  05/23/14 187 lb 14.4 oz (85.231 kg)  04/04/14 193 lb (87.544 kg)  01/12/14 192 lb (87.091 kg)    ASSESSMENT AND PLAN:  Discussed the following assessment and plan:  Cirrhosis of liver with ascites, unspecified hepatic cirrhosis type - followed [facility] recurrent paracentesis  - Plan: Basic metabolic panel, Hemoglobin A1c  Need for 23-polyvalent pneumococcal polysaccharide vaccine - Plan: Pneumococcal polysaccharide vaccine 23-valent greater than or equal to 2yo subcutaneous/IM  Need for prophylactic vaccination and inoculation against influenza - Plan: Flu vaccine HIGH DOSE PF (Fluzone Tri High dose)  Diabetes mellitus,  - Plan: Basic metabolic panel, Hemoglobin A1c  Essential hypertension - Plan: Basic metabolic panel, Hemoglobin A1c  Pulmonary fibrosis - Plan: Basic metabolic panel, Hemoglobin A1c  Umbilical hernia without obstruction and without gangrene - risk of surgery too high risk more than benefit atthis time Reviewed record nlast bmp  8 15  . Do a1c for Quality parameters measures but dont think this is as much of an  issue at present. But need to check  Home health  form  Reviewed and signed  Change lactulose correct rx  Call for refill s also  To have fu piulm and hepar  In about a week  Plan 4 months fu depending   Flu vaccine and pna 23 today -Patient advised to return or notify health care team  if symptoms worsen ,persist or new  concerns arise.  Patient Instructions  Will notify you  of labs when available.    Neta MendsWanda K. Panosh M.D.

## 2014-05-25 ENCOUNTER — Other Ambulatory Visit: Payer: Self-pay | Admitting: Family Medicine

## 2014-05-25 ENCOUNTER — Telehealth: Payer: Self-pay

## 2014-05-25 DIAGNOSIS — E871 Hypo-osmolality and hyponatremia: Secondary | ICD-10-CM

## 2014-05-25 NOTE — Telephone Encounter (Signed)
Pt not accepting call as this time.

## 2014-05-26 ENCOUNTER — Other Ambulatory Visit (INDEPENDENT_AMBULATORY_CARE_PROVIDER_SITE_OTHER): Payer: Medicare Other

## 2014-05-26 DIAGNOSIS — E871 Hypo-osmolality and hyponatremia: Secondary | ICD-10-CM

## 2014-05-27 LAB — BASIC METABOLIC PANEL
BUN: 21 mg/dL (ref 6–23)
CHLORIDE: 92 meq/L — AB (ref 96–112)
CO2: 24 meq/L (ref 19–32)
CREATININE: 1.2 mg/dL (ref 0.4–1.5)
Calcium: 8.5 mg/dL (ref 8.4–10.5)
GFR: 64.08 mL/min (ref >60.00)
Glucose, Bld: 178 mg/dL — ABNORMAL HIGH (ref 70–99)
Potassium: 4.8 mEq/L (ref 3.5–5.1)
Sodium: 122 mEq/L — ABNORMAL LOW (ref 135–145)

## 2014-06-22 DIAGNOSIS — R188 Other ascites: Secondary | ICD-10-CM

## 2014-06-22 DIAGNOSIS — E1169 Type 2 diabetes mellitus with other specified complication: Secondary | ICD-10-CM

## 2014-06-22 DIAGNOSIS — H9193 Unspecified hearing loss, bilateral: Secondary | ICD-10-CM

## 2014-06-22 DIAGNOSIS — M6281 Muscle weakness (generalized): Secondary | ICD-10-CM

## 2014-06-22 DIAGNOSIS — J8410 Pulmonary fibrosis, unspecified: Secondary | ICD-10-CM

## 2014-06-22 DIAGNOSIS — K7460 Unspecified cirrhosis of liver: Secondary | ICD-10-CM

## 2014-06-22 DIAGNOSIS — R2681 Unsteadiness on feet: Secondary | ICD-10-CM

## 2014-06-30 ENCOUNTER — Telehealth: Payer: Self-pay | Admitting: Internal Medicine

## 2014-06-30 NOTE — Telephone Encounter (Signed)
Pt needs post hosp fup w/in one week. pls advise on when to sch. thanks

## 2014-06-30 NOTE — Telephone Encounter (Signed)
[facility] 345 and 4 pm [facility] Tuesday [facility]th   [facility]

## 2014-07-01 NOTE — Telephone Encounter (Signed)
Pt has been sch

## 2014-07-04 ENCOUNTER — Telehealth: Payer: Self-pay | Admitting: Internal Medicine

## 2014-07-04 NOTE — Telephone Encounter (Signed)
Patient Information:  Caller Name: Okey RegalCarol  Phone: [phone]  Patient: Ronald Jensen, Ronald Jensen  Gender: Male  DOB: 03/13/1941  Age: [age] Years  PCP: Berniece AndreasPanosh, Wanda (Family Practice)  Office Follow Up:  Does the office need to follow up with this patient?: No  Instructions For The Office: N/A   Symptoms  Reason For Call & Symptoms: Pts daughter is phoning about the pt waking up with a bloody nose this am. There was blood on his face/a drop or 2 on the sheets. Pt has been congested x several days and developed a cough last night. Pt is a more tired than usual. He did not want to eat. He does have an appt with Dr. Fabian SharpPanosh tomorrow at 3:45. 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 He is to be considered for Hospice. She has talked with the liver specialist nurse on call. She will call back if any changes.  Reviewed Health History In EMR: Yes  Reviewed Medications In EMR: Yes  Reviewed Allergies In EMR: Yes  Reviewed Surgeries / Procedures: Yes  Date of Onset of Symptoms: 07/02/2014  Guideline(s) Used:  Nosebleed  Disposition Per Guideline:   Home Care  Reason For Disposition Reached:   Mild-moderate nosebleed and bleeding has stopped now  Advice Given:  N/A  Patient Will Follow Care Advice:  YES

## 2014-07-05 ENCOUNTER — Ambulatory Visit: Payer: Medicare Other | Admitting: Internal Medicine

## 2014-07-05 ENCOUNTER — Telehealth: Payer: Self-pay | Admitting: Internal Medicine

## 2014-07-05 NOTE — Telephone Encounter (Signed)
1 will be out the week of dec 7th  But could see him 4 pm on tues december 15th

## 2014-07-05 NOTE — Telephone Encounter (Addendum)
Pt had hosp fup tpday, but pt admitted yesterday for transient alteration of awareness and cough to [facility]. They are requesting hops fup w/in 1-2 wks. pls advise on where to schedule. thanks

## 2014-07-06 NOTE — Telephone Encounter (Signed)
Called [facility] and told Erskine Squibbjane of appt date and time. Per Erskine Squibbjane, they will inform pt.

## 2014-07-11 ENCOUNTER — Telehealth: Payer: Self-pay | Admitting: Internal Medicine

## 2014-07-11 NOTE — Telephone Encounter (Signed)
Dr. Richard Taylor calling to report pt was dischargAmedeo Plentyed from hospital on Thursday.  Patient's family is requesting referral for in home hospice care.  However, with patient's dx of end stage liver disease he requires a week belly tap for fluid drainage.  However, hospice is unable to accept patient unless he has a permanent drain placed.  Dr. Ladona Ridgelaylor is recommending pt schedule a post hospital follow up with PCP to determine treatment plan.  Dr. Ladona Ridgelaylor can be reached at [phone] or via his pager at [phone] if needed.

## 2014-07-21 ENCOUNTER — Telehealth: Payer: Self-pay | Admitting: Internal Medicine

## 2014-07-21 NOTE — Telephone Encounter (Signed)
RN Herbert SetaHeather with [facility] called to see if they can get a Hospice Eval for him. Please send a referral to [phone] if you agree. They also need orders to drain the 4inch cath drain please. Needs 5 days a wek for a total of 500ml each time or 3 days a week with 1000ml drained.

## 2014-07-24 NOTE — Telephone Encounter (Signed)
Please  proceed with hospice evaluation and orders for cath drain . Can ask carole which regimen is more comfortable for him.  Also if she has a hospice preference   she may prefer.  (In [place] [facility] [place] )

## 2014-07-25 ENCOUNTER — Telehealth: Payer: Self-pay | Admitting: Internal Medicine

## 2014-07-25 NOTE — Telephone Encounter (Signed)
Pt cancelled hosp fup for tomorrow. Hospice has been called in.

## 2014-07-25 NOTE — Telephone Encounter (Signed)
Pt daughter called to ask if those FMLA paper work has been filled out. Pt has an appt 07/26/14 would like to pick up the paperwork  [facility]

## 2014-07-25 NOTE — Telephone Encounter (Signed)
Was out of office last week . Need more information  About who is caretaking  Will get it done this week .

## 2014-07-26 ENCOUNTER — Ambulatory Visit: Payer: Medicare Other | Admitting: Internal Medicine

## 2014-07-26 NOTE — Telephone Encounter (Signed)
Spoke to Avery DennisonHeather.  Patient's liver doctor has filled out the paper work and nothing else is needed.  Will now close the note.

## 2014-07-27 NOTE — Telephone Encounter (Signed)
Paper work filled out and copy sent to scan.  Sheri should be notified by the front desk to pick up.

## 2014-07-28 ENCOUNTER — Telehealth: Payer: Self-pay | Admitting: Internal Medicine

## 2014-07-28 NOTE — Telephone Encounter (Signed)
Spoke to [place].  Informed her to drop off paper work when she needs it.  She may have her work fax it.

## 2014-07-28 NOTE — Telephone Encounter (Signed)
Pt daughter is calling to info md that her dad in now in hospice care and that she will be needing a new FMLA form soon

## 2014-08-08 IMAGING — CT CT HEAD W/O CM
1 series · 15 of 30 positions shown, 19 images · non-contrast
Comparison: Brain MRI - 05/17/2013

CLINICAL DATA: Confusion, off balance yesterday, denies headache
were history of stroke

EXAM:
CT HEAD WITHOUT CONTRAST
TECHNIQUE: Contiguous axial images were obtained from the base of the skull
through the vertex without intravenous contrast.

[Series 2: head_seq -c 4.5 h37s st · axial · 0.44mm/px · z∈[-99,+27]mm · 15 of 32 slices shown, 19 images]
[im 2/32  brain]
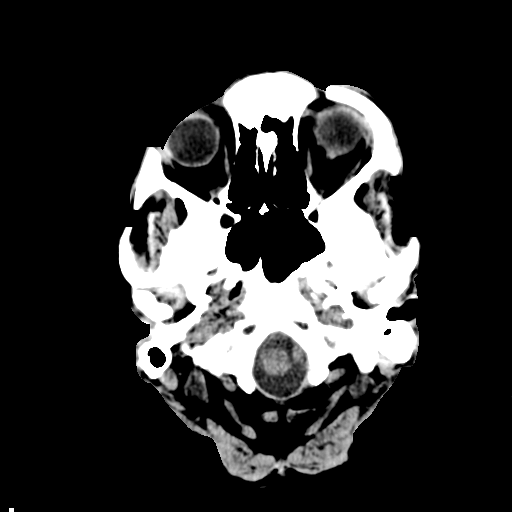
[im 2/32  bone]
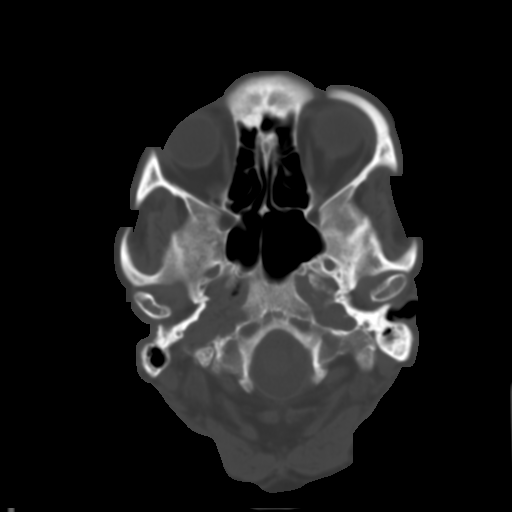
[im 4/32  brain]
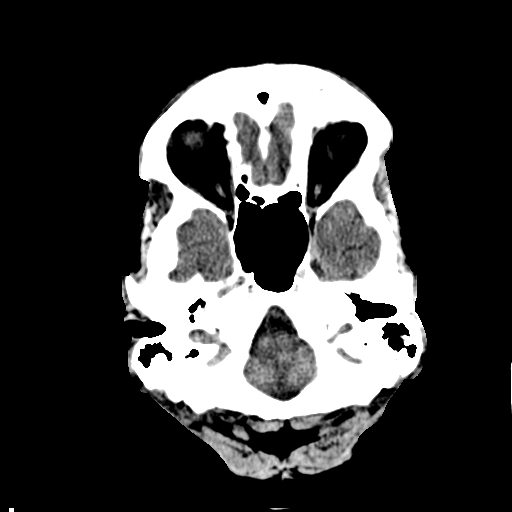
[im 6/32  brain]
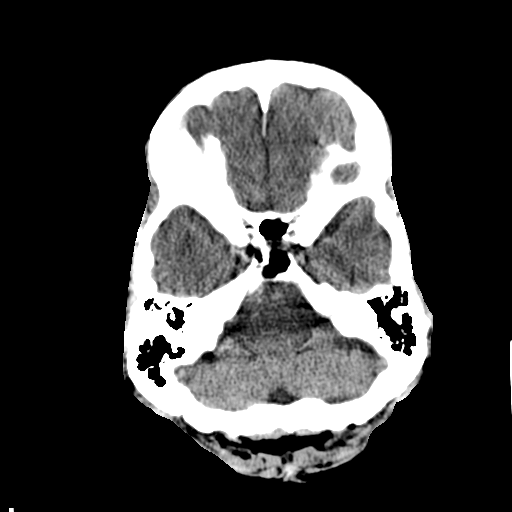
[im 8/32  brain]
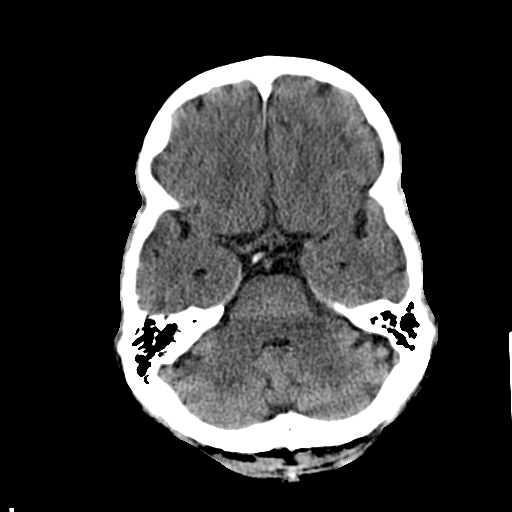
[im 10/32  brain]
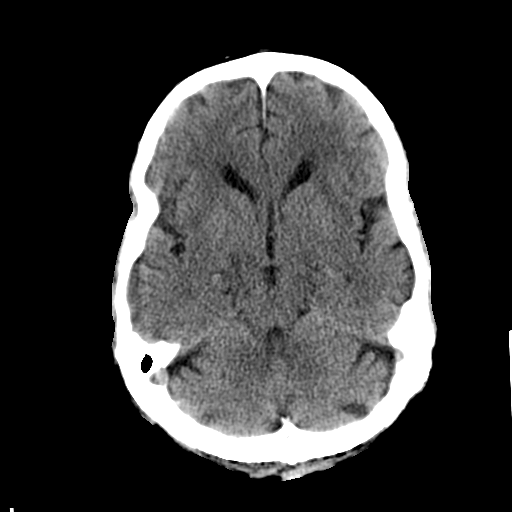
[im 10/32  bone]
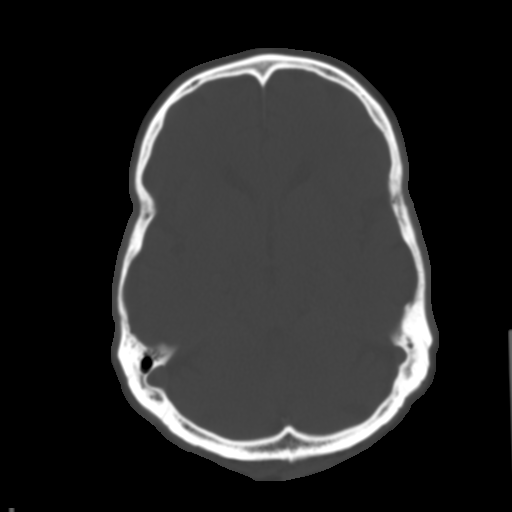
[im 12/32  brain]
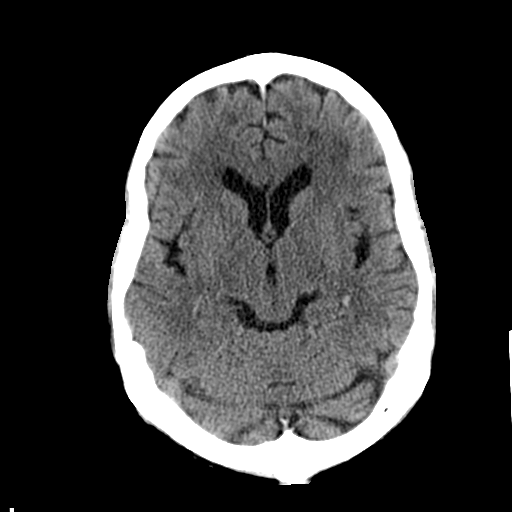
[im 14/32  brain]
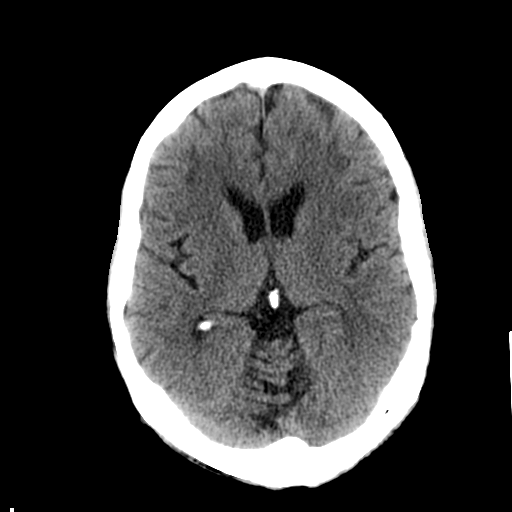
[im 17/32  brain]
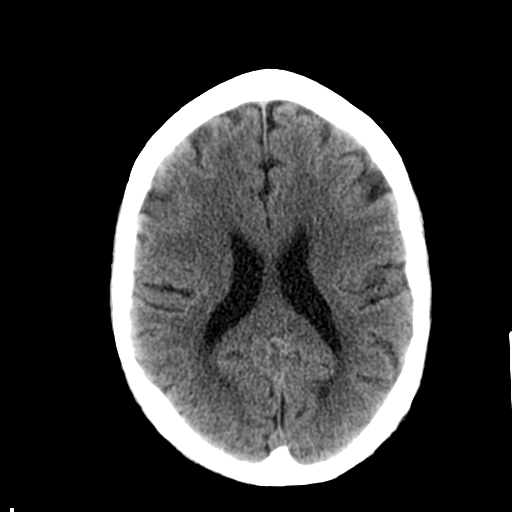
[im 18/32  brain]
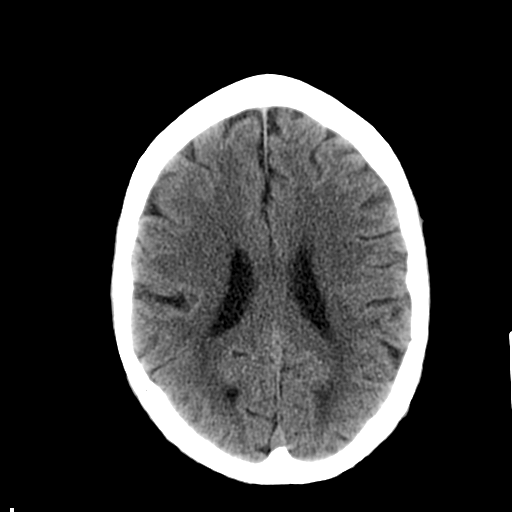
[im 18/32  bone]
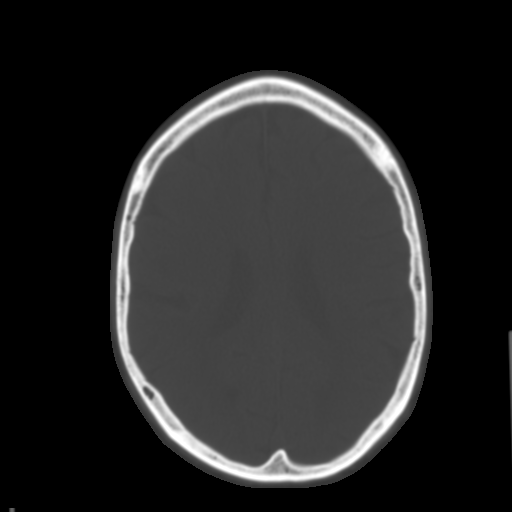
[im 20/32  brain]
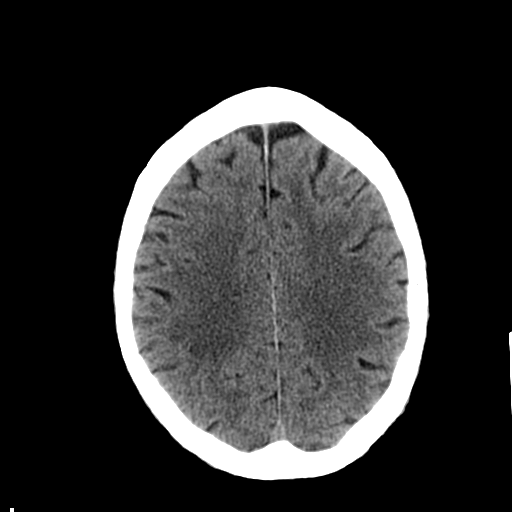
[im 22/32  brain]
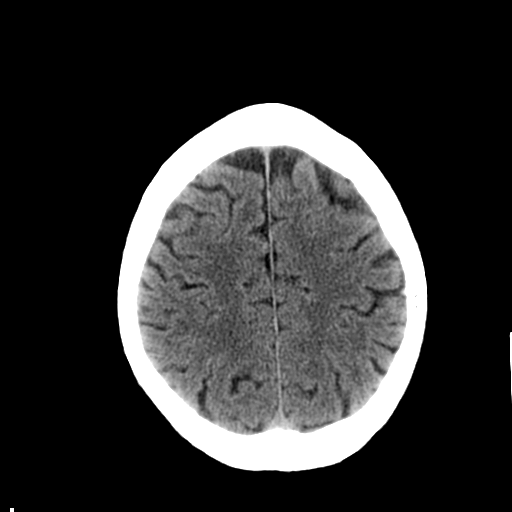
[im 24/32  brain]
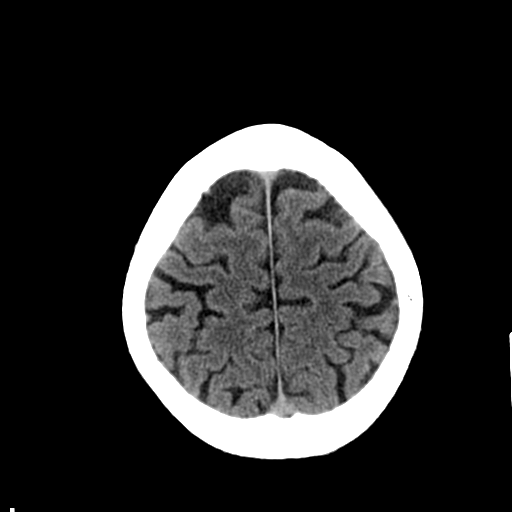
[im 26/32  brain]
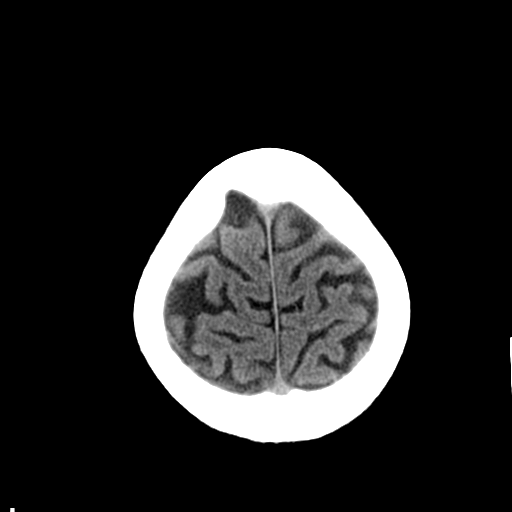
[im 26/32  bone]
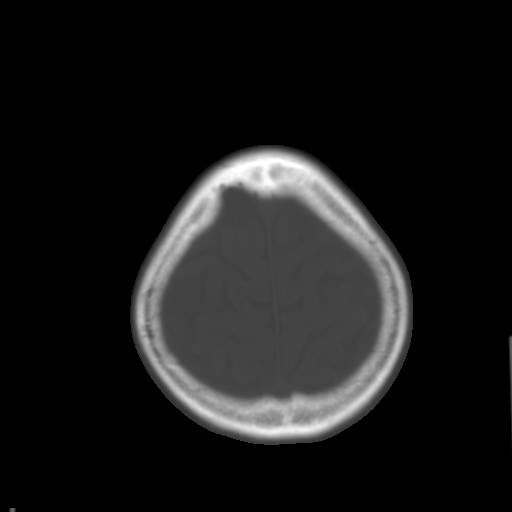
[im 28/32  brain]
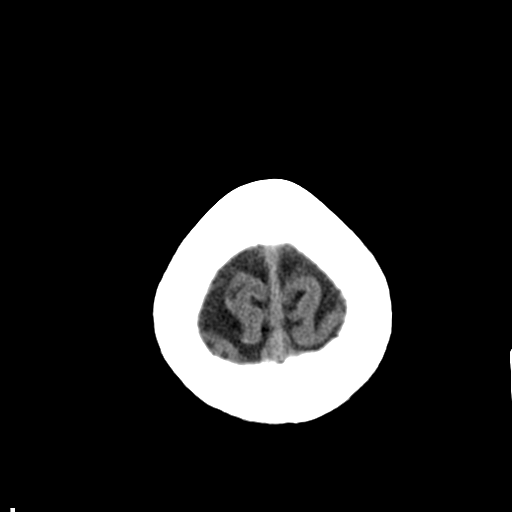
[im 30/32  brain]
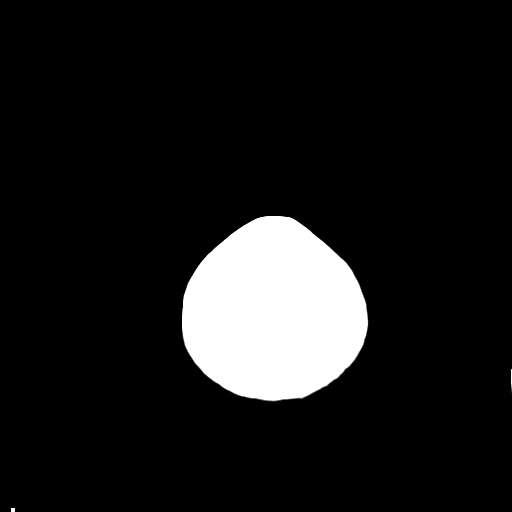

[15 of 30 positions shown; findings below may reference images not displayed]

FINDINGS: There is mild centralized volume loss with commiserate ex vacuo
dilatation of the ventricular system. Scattered periventricular
hypodensities are unchanged. Ill-defined hypodensities within the
bilateral frontal gray-white junction appears grossly unchanged
(image 14, series 2). Given background parenchymal abnormalities,
there is no CT evidence of acute large territory infarct. No
intraparenchymal or extra-axial mass or hemorrhage. Unchanged size
and configuration of the ventricles and basilar cisterns. There is
under pneumatization of the bilateral frontal sinuses. The remaining
paranasal sinuses and mastoid air cells are normally aerated. No
air-fluid levels. Regional soft tissues appear normal. No displaced
calvarial fracture.
IMPRESSION: 1. Mild atrophy and microvascular ischemic disease without acute
intracranial process.
2. Grossly unchanged ill-defined scattered hypodensities within the
bifrontal gray-white junction, again, while possibly the sequela of
chronic small vessel ischemic disease or prior head trauma, less
likely etiologies include the sequela of prior infection or
inflammation. Further evaluation with brain MRI (as was recommended
on prior brain MRI performed [DATE]) could be performed as
clinically indicated to ensure stability.

## 2014-08-12 DEATH — deceased

## 2014-12-01 DIAGNOSIS — E1169 Type 2 diabetes mellitus with other specified complication: Secondary | ICD-10-CM
